# Patient Record
Sex: Male | Born: 1960 | Race: White | Hispanic: No | Smoking: Current every day smoker
Health system: Southern US, Community
[De-identification: ages and names within clinical notes are randomized; demographics above are authoritative.]

## PROBLEM LIST (undated history)

## (undated) DIAGNOSIS — G8929 Other chronic pain: Secondary | ICD-10-CM

## (undated) DIAGNOSIS — K219 Gastro-esophageal reflux disease without esophagitis: Secondary | ICD-10-CM

## (undated) DIAGNOSIS — M549 Dorsalgia, unspecified: Secondary | ICD-10-CM

## (undated) DIAGNOSIS — F329 Major depressive disorder, single episode, unspecified: Secondary | ICD-10-CM

## (undated) DIAGNOSIS — F32A Depression, unspecified: Secondary | ICD-10-CM

## (undated) DIAGNOSIS — J40 Bronchitis, not specified as acute or chronic: Secondary | ICD-10-CM

## (undated) DIAGNOSIS — K589 Irritable bowel syndrome without diarrhea: Secondary | ICD-10-CM

## (undated) DIAGNOSIS — E0500 Thyrotoxicosis with diffuse goiter without thyrotoxic crisis or storm: Secondary | ICD-10-CM

## (undated) DIAGNOSIS — F419 Anxiety disorder, unspecified: Secondary | ICD-10-CM

## (undated) DIAGNOSIS — M1990 Unspecified osteoarthritis, unspecified site: Secondary | ICD-10-CM

## (undated) HISTORY — PX: APPENDECTOMY: SHX54

---

## 2004-02-01 ENCOUNTER — Emergency Department (HOSPITAL_COMMUNITY): Admission: EM | Admit: 2004-02-01 | Discharge: 2004-02-01 | Payer: Self-pay | Admitting: Emergency Medicine

## 2004-09-22 ENCOUNTER — Ambulatory Visit (HOSPITAL_COMMUNITY): Admission: RE | Admit: 2004-09-22 | Discharge: 2004-09-22 | Payer: Self-pay | Admitting: Ophthalmology

## 2005-03-07 ENCOUNTER — Ambulatory Visit: Payer: Self-pay | Admitting: Family Medicine

## 2005-12-04 ENCOUNTER — Ambulatory Visit: Payer: Self-pay | Admitting: Family Medicine

## 2006-01-18 ENCOUNTER — Ambulatory Visit: Payer: Self-pay | Admitting: Family Medicine

## 2006-02-15 ENCOUNTER — Ambulatory Visit: Payer: Self-pay | Admitting: Family Medicine

## 2006-09-11 ENCOUNTER — Ambulatory Visit: Payer: Self-pay | Admitting: Family Medicine

## 2006-09-25 ENCOUNTER — Ambulatory Visit: Payer: Self-pay | Admitting: Family Medicine

## 2006-11-06 ENCOUNTER — Ambulatory Visit: Payer: Self-pay | Admitting: Family Medicine

## 2007-02-13 ENCOUNTER — Ambulatory Visit: Payer: Self-pay | Admitting: Physician Assistant

## 2007-04-09 ENCOUNTER — Ambulatory Visit: Payer: Self-pay | Admitting: Family Medicine

## 2010-11-11 ENCOUNTER — Emergency Department (HOSPITAL_COMMUNITY)
Admission: EM | Admit: 2010-11-11 | Discharge: 2010-11-12 | Disposition: A | Discharge status: Other Acute Inpatient Hospital | Payer: Self-pay | Source: Home / Self Care | Admitting: Emergency Medicine

## 2010-11-12 ENCOUNTER — Inpatient Hospital Stay (HOSPITAL_COMMUNITY)
Admission: EM | Admit: 2010-11-12 | Discharge: 2010-11-14 | Payer: Self-pay | Attending: Psychiatry | Admitting: Psychiatry
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
## 2011-02-06 LAB — BASIC METABOLIC PANEL
BUN: 6 mg/dL (ref 6–23)
CO2: 26 mEq/L (ref 19–32)
Calcium: 8.9 mg/dL (ref 8.4–10.5)
Chloride: 106 mEq/L (ref 96–112)
Creatinine, Ser: 0.85 mg/dL (ref 0.4–1.5)
GFR calc Af Amer: >60 mL/min (ref >60)
GFR calc non Af Amer: >60 mL/min (ref >60)
Glucose, Bld: 132 mg/dL — ABNORMAL HIGH (ref 70–99)
Potassium: 3.7 mEq/L (ref 3.5–5.1)
Sodium: 140 mEq/L (ref 135–145)

## 2011-02-06 LAB — CBC
HCT: 42.3 % (ref 39.0–52.0)
Hemoglobin: 14.6 g/dL (ref 13.0–17.0)
MCH: 29.5 pg (ref 26.0–34.0)
MCHC: 34.5 g/dL (ref 30.0–36.0)
MCV: 85.5 fL (ref 78.0–100.0)
Platelets: 263 10*3/uL (ref 150–400)
RBC: 4.95 MIL/uL (ref 4.22–5.81)
RDW: 13.4 % (ref 11.5–15.5)
WBC: 8.6 10*3/uL (ref 4.0–10.5)

## 2011-02-06 LAB — DIFFERENTIAL
Basophils Absolute: 0.1 10*3/uL (ref 0.0–0.1)
Basophils Relative: 1 % (ref 0–1)
Eosinophils Absolute: 0.4 10*3/uL (ref 0.0–0.7)
Eosinophils Relative: 5 % (ref 0–5)
Lymphocytes Relative: 35 % (ref 12–46)
Lymphs Abs: 3 10*3/uL (ref 0.7–4.0)
Monocytes Absolute: 0.6 10*3/uL (ref 0.1–1.0)
Monocytes Relative: 7 % (ref 3–12)
Neutro Abs: 4.6 10*3/uL (ref 1.7–7.7)
Neutrophils Relative %: 53 % (ref 43–77)

## 2011-02-06 LAB — RAPID URINE DRUG SCREEN, HOSP PERFORMED
Amphetamines: NOT DETECTED
Barbiturates: NOT DETECTED
Benzodiazepines: POSITIVE — AB
Cocaine: POSITIVE — AB
Opiates: NOT DETECTED
Tetrahydrocannabinol: NOT DETECTED

## 2011-02-06 LAB — ETHANOL: Alcohol, Ethyl (B): 127 mg/dL — ABNORMAL HIGH (ref 0–10)

## 2012-02-10 ENCOUNTER — Emergency Department (HOSPITAL_COMMUNITY)
Admission: EM | Admit: 2012-02-10 | Discharge: 2012-02-10 | Disposition: A | Discharge status: Home / Self Care | Payer: Self-pay | Attending: Emergency Medicine | Admitting: Emergency Medicine

## 2012-02-10 ENCOUNTER — Encounter (HOSPITAL_COMMUNITY): Payer: Self-pay

## 2012-02-10 DIAGNOSIS — T3110 Burns involving 10-19% of body surface with 0% to 9% third degree burns: Secondary | ICD-10-CM | POA: Insufficient documentation

## 2012-02-10 DIAGNOSIS — T3111 Burns involving 10-19% of body surface with 10-19% third degree burns: Secondary | ICD-10-CM | POA: Insufficient documentation

## 2012-02-10 DIAGNOSIS — T22299A Burn of second degree of multiple sites of unspecified shoulder and upper limb, except wrist and hand, initial encounter: Secondary | ICD-10-CM | POA: Insufficient documentation

## 2012-02-10 DIAGNOSIS — X000XXA Exposure to flames in uncontrolled fire in building or structure, initial encounter: Secondary | ICD-10-CM | POA: Insufficient documentation

## 2012-02-10 DIAGNOSIS — Y92009 Unspecified place in unspecified non-institutional (private) residence as the place of occurrence of the external cause: Secondary | ICD-10-CM | POA: Insufficient documentation

## 2012-02-10 DIAGNOSIS — T2026XA Burn of second degree of forehead and cheek, initial encounter: Secondary | ICD-10-CM | POA: Insufficient documentation

## 2012-02-10 MED ORDER — SODIUM CHLORIDE 0.9 % IV SOLN: Freq: Once | INTRAVENOUS | Status: DC

## 2012-02-10 MED ORDER — MORPHINE SULFATE 4 MG/ML IJ SOLN
4.0000 mg | Freq: Once | INTRAMUSCULAR | Status: AC
  Administered 2012-02-10: 4 mg via INTRAVENOUS
  Filled 2012-02-10: qty 1

## 2012-02-10 MED ORDER — ONDANSETRON HCL 4 MG/2ML IJ SOLN: 4.0000 mg | Freq: Once | INTRAMUSCULAR | Status: DC

## 2012-02-10 MED ORDER — HYDROCODONE-ACETAMINOPHEN 5-325 MG PO TABS: 1.0000 | ORAL_TABLET | ORAL | Status: AC | PRN

## 2012-02-10 MED ORDER — ONDANSETRON HCL 4 MG/2ML IJ SOLN
4.0000 mg | Freq: Once | INTRAMUSCULAR | Status: AC
  Administered 2012-02-10: 4 mg via INTRAVENOUS
  Filled 2012-02-10: qty 2

## 2012-02-10 MED ORDER — SODIUM CHLORIDE 0.9 % IV BOLUS (SEPSIS)
1000.0000 mL | Freq: Once | INTRAVENOUS | Status: DC
Start: 2012-02-10 — End: 2012-02-10

## 2012-02-10 MED ORDER — MORPHINE SULFATE 4 MG/ML IJ SOLN: 4.0000 mg | Freq: Once | INTRAMUSCULAR | Status: DC

## 2012-02-10 MED ORDER — SILVER SULFADIAZINE 1 % EX CREA: TOPICAL_CREAM | Freq: Every day | CUTANEOUS | Status: AC

## 2012-02-10 MED ORDER — SODIUM CHLORIDE 0.9 % IV BOLUS (SEPSIS)
1000.0000 mL | Freq: Once | INTRAVENOUS | Status: AC
  Administered 2012-02-10: 1000 mL via INTRAVENOUS

## 2012-02-10 MED ORDER — TETANUS-DIPHTH-ACELL PERTUSSIS 5-2.5-18.5 LF-MCG/0.5 IM SUSP
0.5000 mL | Freq: Once | INTRAMUSCULAR | Status: AC
  Administered 2012-02-10: 0.5 mL via INTRAMUSCULAR
  Filled 2012-02-10 (×2): qty 0.5

## 2012-02-10 MED ORDER — BACITRACIN ZINC 500 UNIT/GM EX OINT
TOPICAL_OINTMENT | Freq: Once | CUTANEOUS | Status: AC
  Administered 2012-02-10: 1 via TOPICAL
  Filled 2012-02-10: qty 0.9

## 2012-02-10 MED ORDER — SILVER SULFADIAZINE 1 % EX CREA: TOPICAL_CREAM | Freq: Once | CUTANEOUS | Status: DC

## 2012-02-10 MED ORDER — SILVER SULFADIAZINE 1 % EX CREA
TOPICAL_CREAM | Freq: Once | CUTANEOUS | Status: AC
  Administered 2012-02-10: 02:00:00 via TOPICAL
  Filled 2012-02-10: qty 50
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
## 2012-02-10 NOTE — ED Notes (Signed)
Pt alert & oriented x4, stable gait. Pt given discharge instructions, paperwork & prescription(s). Patient instructed to stop at the registration desk to finish any additional paperwork. pt verbalized understanding. Pt left department w/ no further questions.  
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
## 2013-03-31 ENCOUNTER — Emergency Department (HOSPITAL_COMMUNITY): Payer: Self-pay

## 2013-03-31 ENCOUNTER — Emergency Department (HOSPITAL_COMMUNITY)
Admission: EM | Admit: 2013-03-31 | Discharge: 2013-03-31 | Disposition: A | Discharge status: Home / Self Care | Payer: Self-pay | Attending: Emergency Medicine | Admitting: Emergency Medicine

## 2013-03-31 ENCOUNTER — Encounter (HOSPITAL_COMMUNITY): Payer: Self-pay | Admitting: *Deleted

## 2013-03-31 DIAGNOSIS — K219 Gastro-esophageal reflux disease without esophagitis: Secondary | ICD-10-CM | POA: Insufficient documentation

## 2013-03-31 DIAGNOSIS — R1110 Vomiting, unspecified: Secondary | ICD-10-CM | POA: Insufficient documentation

## 2013-03-31 DIAGNOSIS — Z880 Allergy status to penicillin: Secondary | ICD-10-CM | POA: Insufficient documentation

## 2013-03-31 DIAGNOSIS — Z8709 Personal history of other diseases of the respiratory system: Secondary | ICD-10-CM | POA: Insufficient documentation

## 2013-03-31 DIAGNOSIS — F17200 Nicotine dependence, unspecified, uncomplicated: Secondary | ICD-10-CM | POA: Insufficient documentation

## 2013-03-31 DIAGNOSIS — Z79899 Other long term (current) drug therapy: Secondary | ICD-10-CM | POA: Insufficient documentation

## 2013-03-31 HISTORY — DX: Bronchitis, not specified as acute or chronic: J40

## 2013-03-31 LAB — COMPREHENSIVE METABOLIC PANEL
ALT: 27 U/L (ref 0–53)
AST: 30 U/L (ref 0–37)
Albumin: 5.1 g/dL (ref 3.5–5.2)
Alkaline Phosphatase: 102 U/L (ref 39–117)
BUN: 10 mg/dL (ref 6–23)
CO2: 15 mEq/L — ABNORMAL LOW (ref 19–32)
Calcium: 9.8 mg/dL (ref 8.4–10.5)
Chloride: 87 mEq/L — ABNORMAL LOW (ref 96–112)
Creatinine, Ser: 0.87 mg/dL (ref 0.50–1.35)
GFR calc Af Amer: >90 mL/min (ref >90)
GFR calc non Af Amer: >90 mL/min (ref >90)
Glucose, Bld: 76 mg/dL (ref 70–99)
Potassium: 4.3 mEq/L (ref 3.5–5.1)
Sodium: 129 mEq/L — ABNORMAL LOW (ref 135–145)
Total Bilirubin: 0.7 mg/dL (ref 0.3–1.2)
Total Protein: 8.4 g/dL — ABNORMAL HIGH (ref 6.0–8.3)

## 2013-03-31 LAB — CBC WITH DIFFERENTIAL/PLATELET
Basophils Absolute: 0 10*3/uL (ref 0.0–0.1)
Basophils Relative: 0 % (ref 0–1)
Eosinophils Absolute: 0 10*3/uL (ref 0.0–0.7)
Eosinophils Relative: 0 % (ref 0–5)
HCT: 45.8 % (ref 39.0–52.0)
Hemoglobin: 16.1 g/dL (ref 13.0–17.0)
Lymphocytes Relative: 8 % — ABNORMAL LOW (ref 12–46)
Lymphs Abs: 1.2 10*3/uL (ref 0.7–4.0)
MCH: 30.9 pg (ref 26.0–34.0)
MCHC: 35.2 g/dL (ref 30.0–36.0)
MCV: 87.9 fL (ref 78.0–100.0)
Monocytes Absolute: 0.9 10*3/uL (ref 0.1–1.0)
Monocytes Relative: 6 % (ref 3–12)
Neutro Abs: 13.6 10*3/uL — ABNORMAL HIGH (ref 1.7–7.7)
Neutrophils Relative %: 86 % — ABNORMAL HIGH (ref 43–77)
Platelets: 343 10*3/uL (ref 150–400)
RBC: 5.21 MIL/uL (ref 4.22–5.81)
RDW: 14.3 % (ref 11.5–15.5)
WBC: 15.7 10*3/uL — ABNORMAL HIGH (ref 4.0–10.5)

## 2013-03-31 LAB — ETHANOL: Alcohol, Ethyl (B): <11 mg/dL (ref 0–11)

## 2013-03-31 LAB — TROPONIN I: Troponin I: <0.3 ng/mL (ref <0.30)

## 2013-03-31 MED ORDER — ALPRAZOLAM 1 MG PO TABS: 1.0000 mg | ORAL_TABLET | Freq: Every evening | ORAL | Status: DC | PRN

## 2013-03-31 MED ORDER — PROMETHAZINE HCL 25 MG PO TABS: 25.0000 mg | ORAL_TABLET | Freq: Four times a day (QID) | ORAL | Status: DC | PRN

## 2013-03-31 MED ORDER — PANTOPRAZOLE SODIUM 40 MG IV SOLR
40.0000 mg | Freq: Once | INTRAVENOUS | Status: AC
  Administered 2013-03-31: 40 mg via INTRAVENOUS
  Filled 2013-03-31: qty 40

## 2013-03-31 MED ORDER — HYDROCODONE-ACETAMINOPHEN 5-325 MG PO TABS: ORAL_TABLET | ORAL | Status: DC

## 2013-03-31 MED ORDER — ONDANSETRON HCL 4 MG/2ML IJ SOLN
4.0000 mg | Freq: Once | INTRAMUSCULAR | Status: AC
  Administered 2013-03-31: 4 mg via INTRAVENOUS
  Filled 2013-03-31: qty 2

## 2013-03-31 MED ORDER — RANITIDINE HCL 150 MG PO CAPS: 150.0000 mg | ORAL_CAPSULE | Freq: Two times a day (BID) | ORAL | Status: DC

## 2013-03-31 MED ORDER — GI COCKTAIL ~~LOC~~
30.0000 mL | Freq: Once | ORAL | Status: AC
  Administered 2013-03-31: 30 mL via ORAL
  Filled 2013-03-31: qty 30

## 2013-03-31 MED ORDER — SODIUM CHLORIDE 0.9 % IV BOLUS (SEPSIS)
1000.0000 mL | Freq: Once | INTRAVENOUS | Status: AC
  Administered 2013-03-31: 1000 mL via INTRAVENOUS

## 2013-03-31 NOTE — ED Notes (Signed)
Vomiting, sob, chest pain, diarrhea.

## 2013-03-31 NOTE — ED Provider Notes (Signed)
History     CSN: [id number]  Arrival date & time 03/31/13  1825   First MD Initiated Contact with Patient 03/31/13 2024      Chief Complaint  Patient presents with  . Shortness of Breath    (Consider location/radiation/quality/duration/timing/severity/associated sxs/prior treatment) Patient is a 52 y.o. male presenting with shortness of breath. The history is provided by the patient (pt complains of indigestion and vomiting.  he has been out of his xanax and hydrocodone for one week). No language interpreter was used.  Shortness of Breath Severity:  Moderate Onset quality:  Gradual Timing:  Intermittent Progression:  Waxing and waning Chronicity:  Recurrent Context: not activity   Relieved by:  Nothing Associated symptoms: vomiting   Associated symptoms: no abdominal pain, no chest pain, no cough, no headaches and no rash     Past Medical History  Diagnosis Date  . Bronchitis     Past Surgical History  Procedure Laterality Date  . Appendectomy      History reviewed. No pertinent family history.  History  Substance Use Topics  . Smoking status: Current Every Day Smoker -- 1.00 packs/day  . Smokeless tobacco: Not on file  . Alcohol Use: Yes     Comment: daily      Review of Systems  Constitutional: Negative for appetite change and fatigue.  HENT: Negative for congestion, sinus pressure and ear discharge.   Eyes: Negative for discharge.  Respiratory: Positive for shortness of breath. Negative for cough.   Cardiovascular: Negative for chest pain.  Gastrointestinal: Positive for vomiting. Negative for abdominal pain and diarrhea.  Genitourinary: Negative for frequency and hematuria.  Musculoskeletal: Negative for back pain.  Skin: Negative for rash.  Neurological: Negative for seizures and headaches.  Psychiatric/Behavioral: Negative for hallucinations.    Allergies  Penicillins  Home Medications   Current Outpatient Rx  Name  Route  Sig  Dispense   Refill  . ALPRAZolam (XANAX) 1 MG tablet   Oral   Take 1 mg by mouth 2 (two) times daily as needed for sleep or anxiety.          Marland Kitchen HYDROcodone-acetaminophen (NORCO/VICODIN) 5-325 MG per tablet   Oral   Take 1 tablet by mouth every 6 (six) hours as needed for pain.         . meloxicam (MOBIC) 15 MG tablet   Oral   Take 15 mg by mouth daily.         . naproxen sodium (ALEVE) 220 MG tablet   Oral   Take 440 mg by mouth daily as needed.         . ALPRAZolam (XANAX) 1 MG tablet   Oral   Take 1 tablet (1 mg total) by mouth at bedtime as needed for sleep.   15 tablet   0   . HYDROcodone-acetaminophen (NORCO/VICODIN) 5-325 MG per tablet      Take one twice a day for pain   30 tablet   0   . promethazine (PHENERGAN) 25 MG tablet   Oral   Take 1 tablet (25 mg total) by mouth every 6 (six) hours as needed for nausea.   30 tablet   0   . ranitidine (ZANTAC) 150 MG capsule   Oral   Take 1 capsule (150 mg total) by mouth 2 (two) times daily.   60 capsule   0     BP 145/85  Pulse 100  Temp(Src) 96.9 F (36.1 C) (Oral)  Resp 18  Ht  5\' 8"  (1.727 m)  Wt 135 lb (61.236 kg)  BMI 20.53 kg/m2  SpO2 96%  Physical Exam  Constitutional: He is oriented to person, place, and time. He appears well-developed.  HENT:  Head: Normocephalic.  Eyes: Conjunctivae and EOM are normal. No scleral icterus.  Neck: Neck supple. No thyromegaly present.  Cardiovascular: Normal rate and regular rhythm.  Exam reveals no gallop and no friction rub.   No murmur heard. Pulmonary/Chest: No stridor. He has no wheezes. He has no rales. He exhibits no tenderness.  Abdominal: He exhibits no distension. There is tenderness. There is no rebound.  Musculoskeletal: Normal range of motion. He exhibits no edema.  Lymphadenopathy:    He has no cervical adenopathy.  Neurological: He is oriented to person, place, and time. Coordination normal.  Skin: No rash noted. No erythema.  Psychiatric: He has a  normal mood and affect. His behavior is normal.    ED Course  Procedures (including critical care time)  Labs Reviewed  CBC WITH DIFFERENTIAL - Abnormal; Notable for the following:    WBC 15.7 (*)    Neutrophils Relative 86 (*)    Neutro Abs 13.6 (*)    Lymphocytes Relative 8 (*)    All other components within normal limits  COMPREHENSIVE METABOLIC PANEL - Abnormal; Notable for the following:    Sodium 129 (*)    Chloride 87 (*)    CO2 15 (*)    Total Protein 8.4 (*)    All other components within normal limits  TROPONIN I  ETHANOL   Dg Chest 2 View  03/31/2013  *RADIOLOGY REPORT*  Clinical Data: Shortness of breath, vomiting since 2:00 p.m. today, history smoking  CHEST - 2 VIEW  Comparison: 11/09/2009  Findings: Normal heart size, mediastinal contours, and pulmonary vascularity. Mild chronic bronchitic changes and hyperinflation. No acute infiltrate, pleural effusion or pneumothorax. Bones unremarkable.  IMPRESSION: Chronic bronchitic changes and hyperinflation, cannot exclude asthma.   Original Report Authenticated By: Ulyses Southward, M.D.      1. GERD (gastroesophageal reflux disease)   2. Vomiting       MDM          Benny Lennert, MD 03/31/13 2127

## 2013-03-31 NOTE — ED Notes (Signed)
Pt alert & oriented x4, stable gait. Patient given discharge instructions, paperwork & prescription(s). Patient  instructed to stop at the registration desk to finish any additional paperwork. Patient verbalized understanding. Pt left department w/ no further questions. 

## 2014-02-04 ENCOUNTER — Emergency Department (HOSPITAL_COMMUNITY): Payer: Self-pay

## 2014-02-04 ENCOUNTER — Emergency Department (HOSPITAL_COMMUNITY)
Admission: EM | Admit: 2014-02-04 | Discharge: 2014-02-04 | Disposition: A | Discharge status: Home / Self Care | Payer: Self-pay | Attending: Emergency Medicine | Admitting: Emergency Medicine

## 2014-02-04 ENCOUNTER — Encounter (HOSPITAL_COMMUNITY): Payer: Self-pay | Admitting: Emergency Medicine

## 2014-02-04 DIAGNOSIS — F3289 Other specified depressive episodes: Secondary | ICD-10-CM | POA: Insufficient documentation

## 2014-02-04 DIAGNOSIS — Z79899 Other long term (current) drug therapy: Secondary | ICD-10-CM | POA: Insufficient documentation

## 2014-02-04 DIAGNOSIS — Z880 Allergy status to penicillin: Secondary | ICD-10-CM | POA: Insufficient documentation

## 2014-02-04 DIAGNOSIS — F329 Major depressive disorder, single episode, unspecified: Secondary | ICD-10-CM | POA: Insufficient documentation

## 2014-02-04 DIAGNOSIS — F17200 Nicotine dependence, unspecified, uncomplicated: Secondary | ICD-10-CM | POA: Insufficient documentation

## 2014-02-04 DIAGNOSIS — Z8709 Personal history of other diseases of the respiratory system: Secondary | ICD-10-CM | POA: Insufficient documentation

## 2014-02-04 DIAGNOSIS — Z9089 Acquired absence of other organs: Secondary | ICD-10-CM | POA: Insufficient documentation

## 2014-02-04 DIAGNOSIS — R197 Diarrhea, unspecified: Secondary | ICD-10-CM | POA: Insufficient documentation

## 2014-02-04 DIAGNOSIS — Z791 Long term (current) use of non-steroidal anti-inflammatories (NSAID): Secondary | ICD-10-CM | POA: Insufficient documentation

## 2014-02-04 DIAGNOSIS — F411 Generalized anxiety disorder: Secondary | ICD-10-CM | POA: Insufficient documentation

## 2014-02-04 DIAGNOSIS — Z8639 Personal history of other endocrine, nutritional and metabolic disease: Secondary | ICD-10-CM | POA: Insufficient documentation

## 2014-02-04 DIAGNOSIS — R112 Nausea with vomiting, unspecified: Secondary | ICD-10-CM | POA: Insufficient documentation

## 2014-02-04 DIAGNOSIS — R109 Unspecified abdominal pain: Secondary | ICD-10-CM

## 2014-02-04 DIAGNOSIS — R1084 Generalized abdominal pain: Secondary | ICD-10-CM | POA: Insufficient documentation

## 2014-02-04 DIAGNOSIS — Z862 Personal history of diseases of the blood and blood-forming organs and certain disorders involving the immune mechanism: Secondary | ICD-10-CM | POA: Insufficient documentation

## 2014-02-04 DIAGNOSIS — K219 Gastro-esophageal reflux disease without esophagitis: Secondary | ICD-10-CM | POA: Insufficient documentation

## 2014-02-04 HISTORY — DX: Anxiety disorder, unspecified: F41.9

## 2014-02-04 HISTORY — DX: Gastro-esophageal reflux disease without esophagitis: K21.9

## 2014-02-04 HISTORY — DX: Major depressive disorder, single episode, unspecified: F32.9

## 2014-02-04 HISTORY — DX: Depression, unspecified: F32.A

## 2014-02-04 HISTORY — DX: Thyrotoxicosis with diffuse goiter without thyrotoxic crisis or storm: E05.00

## 2014-02-04 LAB — CBC WITH DIFFERENTIAL/PLATELET
Basophils Absolute: 0 10*3/uL (ref 0.0–0.1)
Basophils Relative: 1 % (ref 0–1)
EOS ABS: 0.2 10*3/uL (ref 0.0–0.7)
EOS PCT: 3 % (ref 0–5)
HCT: 42 % (ref 39.0–52.0)
HEMOGLOBIN: 14.5 g/dL (ref 13.0–17.0)
LYMPHS ABS: 1.7 10*3/uL (ref 0.7–4.0)
Lymphocytes Relative: 20 % (ref 12–46)
MCH: 30.5 pg (ref 26.0–34.0)
MCHC: 34.5 g/dL (ref 30.0–36.0)
MCV: 88.4 fL (ref 78.0–100.0)
MONOS PCT: 9 % (ref 3–12)
Monocytes Absolute: 0.8 10*3/uL (ref 0.1–1.0)
Neutro Abs: 5.6 10*3/uL (ref 1.7–7.7)
Neutrophils Relative %: 68 % (ref 43–77)
PLATELETS: 283 10*3/uL (ref 150–400)
RBC: 4.75 MIL/uL (ref 4.22–5.81)
RDW: 13 % (ref 11.5–15.5)
WBC: 8.3 10*3/uL (ref 4.0–10.5)

## 2014-02-04 LAB — COMPREHENSIVE METABOLIC PANEL
ALBUMIN: 3.7 g/dL (ref 3.5–5.2)
ALT: 17 U/L (ref 0–53)
AST: 24 U/L (ref 0–37)
Alkaline Phosphatase: 96 U/L (ref 39–117)
BILIRUBIN TOTAL: 0.4 mg/dL (ref 0.3–1.2)
BUN: 9 mg/dL (ref 6–23)
CHLORIDE: 105 meq/L (ref 96–112)
CO2: 25 mEq/L (ref 19–32)
CREATININE: 0.72 mg/dL (ref 0.50–1.35)
Calcium: 8.9 mg/dL (ref 8.4–10.5)
GFR calc non Af Amer: >90 mL/min (ref >90)
GLUCOSE: 96 mg/dL (ref 70–99)
Potassium: 4.7 mEq/L (ref 3.7–5.3)
Sodium: 141 mEq/L (ref 137–147)
TOTAL PROTEIN: 7.2 g/dL (ref 6.0–8.3)

## 2014-02-04 LAB — LIPASE, BLOOD: Lipase: 64 U/L — ABNORMAL HIGH (ref 11–59)

## 2014-02-04 MED ORDER — HYDROCODONE-ACETAMINOPHEN 5-325 MG PO TABS: 1.0000 | ORAL_TABLET | Freq: Four times a day (QID) | ORAL | Status: DC | PRN

## 2014-02-04 MED ORDER — SODIUM CHLORIDE 0.9 % IV BOLUS (SEPSIS)
250.0000 mL | Freq: Once | INTRAVENOUS | Status: AC
  Administered 2014-02-04: 250 mL via INTRAVENOUS

## 2014-02-04 MED ORDER — LOPERAMIDE HCL 2 MG PO TABS: 2.0000 mg | ORAL_TABLET | Freq: Four times a day (QID) | ORAL | Status: DC | PRN

## 2014-02-04 MED ORDER — HYDROMORPHONE HCL PF 1 MG/ML IJ SOLN
1.0000 mg | Freq: Once | INTRAMUSCULAR | Status: AC
  Administered 2014-02-04: 1 mg via INTRAVENOUS
  Filled 2014-02-04: qty 1

## 2014-02-04 MED ORDER — IOHEXOL 300 MG/ML  SOLN
50.0000 mL | Freq: Once | INTRAMUSCULAR | Status: AC | PRN
  Administered 2014-02-04: 25 mL via ORAL

## 2014-02-04 MED ORDER — IOHEXOL 300 MG/ML  SOLN
100.0000 mL | Freq: Once | INTRAMUSCULAR | Status: AC | PRN
  Administered 2014-02-04: 100 mL via INTRAVENOUS

## 2014-02-04 MED ORDER — ONDANSETRON HCL 4 MG/2ML IJ SOLN
4.0000 mg | Freq: Once | INTRAMUSCULAR | Status: AC
  Administered 2014-02-04: 4 mg via INTRAVENOUS
  Filled 2014-02-04: qty 2

## 2014-02-04 MED ORDER — SODIUM CHLORIDE 0.9 % IV BOLUS (SEPSIS): 1000.0000 mL | Freq: Once | INTRAVENOUS | Status: DC

## 2014-02-04 MED ORDER — SODIUM CHLORIDE 0.9 % IV SOLN: INTRAVENOUS | Status: DC

## 2014-02-04 MED ORDER — ONDANSETRON 4 MG PO TBDP: 4.0000 mg | ORAL_TABLET | Freq: Three times a day (TID) | ORAL | Status: DC | PRN

## 2014-02-04 NOTE — ED Provider Notes (Signed)
CSN: [id number]     Arrival date & time 02/04/14  4098 History   First MD Initiated Contact with Patient 02/04/14 [phone]     Chief Complaint  Patient presents with  . Abdominal Pain     (Consider location/radiation/quality/duration/timing/severity/associated sxs/prior Treatment) Patient is a 53 y.o. male presenting with abdominal pain. The history is provided by the patient.  Abdominal Pain Associated symptoms: diarrhea, nausea and vomiting   Associated symptoms: no chest pain, no dysuria, no fever and no shortness of breath    Patient with complaint of abdominal pain generalized for about a week. Associated with occasional vomiting occasional diarrhea. Bowel movements have been black in color however patient's been on Pepto-Bismol. Patient also with a single bit of red but the bowel movements. No blood in the vomit. Patient is followed by [facility]. Patient states that he's feeling weak and dehydrated.  Past Medical History  Diagnosis Date  . Bronchitis   . GERD (gastroesophageal reflux disease)   . Graves disease   . Depression   . Anxiety    Past Surgical History  Procedure Laterality Date  . Appendectomy     Family History  Problem Relation Age of Onset  . Stroke Mother    History  Substance Use Topics  . Smoking status: Current Every Day Smoker -- 1.00 packs/day for 35 years    Types: Cigarettes  . Smokeless tobacco: Never Used  . Alcohol Use: 7.2 oz/week    12 Cans of beer per week     Comment: daily    Review of Systems  Constitutional: Negative for fever.  HENT: Negative for congestion.   Eyes: Negative for redness.  Respiratory: Negative for shortness of breath.   Cardiovascular: Negative for chest pain.  Gastrointestinal: Positive for nausea, vomiting, abdominal pain and diarrhea.  Genitourinary: Negative for dysuria.  Musculoskeletal: Negative for back pain.  Skin: Negative for rash.  Neurological: Negative for headaches.   Hematological: Does not bruise/bleed easily.  Psychiatric/Behavioral: Negative for confusion.      Allergies  Penicillins  Home Medications   Current Outpatient Rx  Name  Route  Sig  Dispense  Refill  . ALPRAZolam (XANAX) 1 MG tablet   Oral   Take 2 mg by mouth at bedtime.          Marland Kitchen esomeprazole (NEXIUM) 20 MG capsule   Oral   Take 20 mg by mouth daily.         . meloxicam (MOBIC) 15 MG tablet   Oral   Take 15 mg by mouth daily.         Marland Kitchen HYDROcodone-acetaminophen (NORCO/VICODIN) 5-325 MG per tablet   Oral   Take 1 tablet by mouth 3 (three) times daily as needed for moderate pain.          Marland Kitchen HYDROcodone-acetaminophen (NORCO/VICODIN) 5-325 MG per tablet   Oral   Take 1-2 tablets by mouth every 6 (six) hours as needed for moderate pain.   20 tablet   0   . loperamide (IMODIUM A-D) 2 MG tablet   Oral   Take 1 tablet (2 mg total) by mouth 4 (four) times daily as needed for diarrhea or loose stools.   30 tablet   0   . ondansetron (ZOFRAN ODT) 4 MG disintegrating tablet   Oral   Take 1 tablet (4 mg total) by mouth every 8 (eight) hours as needed.   10 tablet   1    BP 129/92  Pulse 92  Temp(Src) 98.1 F (36.7 C) (Oral)  Ht 5\' 8"  (1.727 m)  Wt 130 lb (58.968 kg)  BMI 19.77 kg/m2  SpO2 99% Physical Exam  Nursing note and vitals reviewed. Constitutional: He is oriented to person, place, and time. He appears well-developed and well-nourished. No distress.  HENT:  Head: Normocephalic and atraumatic.  Mouth/Throat: Oropharynx is clear and moist.  Eyes: Conjunctivae and EOM are normal. Pupils are equal, round, and reactive to light.  Neck: Normal range of motion.  Cardiovascular: Normal rate and regular rhythm.   No murmur heard. Pulmonary/Chest: Effort normal and breath sounds normal. No respiratory distress.  Abdominal: Soft. Bowel sounds are normal. There is no tenderness.  Musculoskeletal: Normal range of motion. He exhibits no edema.   Neurological: He is alert and oriented to person, place, and time. No cranial nerve deficit. He exhibits normal muscle tone. Coordination normal.  Skin: Skin is warm. No rash noted. No erythema.    ED Course  Procedures (including critical care time) Labs Review Labs Reviewed  LIPASE, BLOOD - Abnormal; Notable for the following:    Lipase 64 (*)    All other components within normal limits  COMPREHENSIVE METABOLIC PANEL  CBC WITH DIFFERENTIAL   Results for orders placed during the hospital encounter of 02/04/14  COMPREHENSIVE METABOLIC PANEL      Result Value Ref Range   Sodium 141  137 - 147 mEq/L   Potassium 4.7  3.7 - 5.3 mEq/L   Chloride 105  96 - 112 mEq/L   CO2 25  19 - 32 mEq/L   Glucose, Bld 96  70 - 99 mg/dL   BUN 9  6 - 23 mg/dL   Creatinine, Ser 1.61  0.50 - 1.35 mg/dL   Calcium 8.9  8.4 - 09.6 mg/dL   Total Protein 7.2  6.0 - 8.3 g/dL   Albumin 3.7  3.5 - 5.2 g/dL   AST 24  0 - 37 U/L   ALT 17  0 - 53 U/L   Alkaline Phosphatase 96  39 - 117 U/L   Total Bilirubin 0.4  0.3 - 1.2 mg/dL   GFR calc non Af Amer >90  >90 mL/min   GFR calc Af Amer >90  >90 mL/min  LIPASE, BLOOD      Result Value Ref Range   Lipase 64 (*) 11 - 59 U/L  CBC WITH DIFFERENTIAL      Result Value Ref Range   WBC 8.3  4.0 - 10.5 K/uL   RBC 4.75  4.22 - 5.81 MIL/uL   Hemoglobin 14.5  13.0 - 17.0 g/dL   HCT 04.5  40.9 - 81.1 %   MCV 88.4  78.0 - 100.0 fL   MCH 30.5  26.0 - 34.0 pg   MCHC 34.5  30.0 - 36.0 g/dL   RDW 91.4  78.2 - 95.6 %   Platelets 283  150 - 400 K/uL   Neutrophils Relative % 68  43 - 77 %   Neutro Abs 5.6  1.7 - 7.7 K/uL   Lymphocytes Relative 20  12 - 46 %   Lymphs Abs 1.7  0.7 - 4.0 K/uL   Monocytes Relative 9  3 - 12 %   Monocytes Absolute 0.8  0.1 - 1.0 K/uL   Eosinophils Relative 3  0 - 5 %   Eosinophils Absolute 0.2  0.0 - 0.7 K/uL   Basophils Relative 1  0 - 1 %   Basophils Absolute 0.0  0.0 - 0.1 K/uL    Imaging Review Dg Chest 2 View  02/04/2014    CLINICAL DATA Pain with nausea  EXAM CHEST  2 VIEW  COMPARISON Mar 31, 2013  FINDINGS Lungs are slightly hyperexpanded but clear. Heart size and pulmonary vascularity are normal. No adenopathy. No bone lesions.  IMPRESSION No edema or consolidation.  Lungs mildly hyperexpanded.  SIGNATURE  Electronically Signed   By: Bretta BangWilliam  Woodruff M.D.   On: 02/04/2014 09:09   Ct Abdomen Pelvis W Contrast  02/04/2014   CLINICAL DATA Abdominal pain  EXAM CT ABDOMEN AND PELVIS WITH CONTRAST  TECHNIQUE Multidetector CT imaging of the abdomen and pelvis was performed using the standard protocol following bolus administration of intravenous contrast.  CONTRAST 100mL OMNIPAQUE IOHEXOL 300 MG/ML SOLN, 25mL OMNIPAQUE IOHEXOL 300 MG/ML SOLN  COMPARISON None.  FINDINGS Lung bases are clear with mild scarring or atelectasis in the right lower lobe.  Liver gallbladder and bile ducts are normal. Pancreas and spleen are normal. Kidneys are normal without obstruction or stone or mass.  Negative for bowel obstruction . Appendectomy clips are noted. Sigmoid diverticulosis is present. No free fluid.  Large cyst in the subcutaneous tissues to the right buttocks. This measures 6.8 x 2.7 cm. This may be a benign cyst or possibly a chronic hematoma. The surrounding fat appears normal.  L5-S1 spondylosis.  No acute fracture.  IMPRESSION Sigmoid diverticulosis without evidence of acute diverticulitis.  Prior appendectomy.  Benign appearing large cyst in the right buttock.  No acute abnormality.  SIGNATURE  Electronically Signed   By: Marlan Palauharles  Clark M.D.   On: 02/04/2014 09:44     EKG Interpretation None      MDM   Final diagnoses:  Abdominal pain    Workup for the persistent abdominal pain with occasional nausea vomiting and diarrhea is negative. Will treat symptomatically. Patient called the primary care Dr. If symptoms persist endoscopies may be required. CT scan here today negative. Labs without significant abnormalities. Other than  the lipase being mildly elevated some mild pancreatitis is a possibility as the cause of pain.    Shelda JakesScott W. Mose Colaizzi, MD 02/04/14 [phone]

## 2014-02-04 NOTE — Discharge Instructions (Signed)
Workup for abdominal pain without any significant findings however followup with your regular Dr. if not improved in the next few days would be important. Take medications as directed. Return for any new or worse symptoms.

## 2014-02-04 NOTE — ED Notes (Signed)
Patient c/o lower abd pain with nausea, vomiting, and diarrhea since Thursday. Per patient progressively worse today. Per patient black watery stools. Patient unsure of any fevers.

## 2014-02-19 ENCOUNTER — Encounter (INDEPENDENT_AMBULATORY_CARE_PROVIDER_SITE_OTHER): Payer: Self-pay | Admitting: *Deleted

## 2014-02-20 ENCOUNTER — Encounter (HOSPITAL_COMMUNITY): Payer: Self-pay | Admitting: Emergency Medicine

## 2014-02-20 ENCOUNTER — Emergency Department (HOSPITAL_COMMUNITY)
Admission: EM | Admit: 2014-02-20 | Discharge: 2014-02-20 | Disposition: A | Discharge status: Home / Self Care | Payer: Self-pay | Attending: Emergency Medicine | Admitting: Emergency Medicine

## 2014-02-20 DIAGNOSIS — F1193 Opioid use, unspecified with withdrawal: Secondary | ICD-10-CM

## 2014-02-20 DIAGNOSIS — Z880 Allergy status to penicillin: Secondary | ICD-10-CM | POA: Insufficient documentation

## 2014-02-20 DIAGNOSIS — R42 Dizziness and giddiness: Secondary | ICD-10-CM | POA: Insufficient documentation

## 2014-02-20 DIAGNOSIS — Z8709 Personal history of other diseases of the respiratory system: Secondary | ICD-10-CM | POA: Insufficient documentation

## 2014-02-20 DIAGNOSIS — F3289 Other specified depressive episodes: Secondary | ICD-10-CM | POA: Insufficient documentation

## 2014-02-20 DIAGNOSIS — Z862 Personal history of diseases of the blood and blood-forming organs and certain disorders involving the immune mechanism: Secondary | ICD-10-CM | POA: Insufficient documentation

## 2014-02-20 DIAGNOSIS — Z8639 Personal history of other endocrine, nutritional and metabolic disease: Secondary | ICD-10-CM | POA: Insufficient documentation

## 2014-02-20 DIAGNOSIS — F1123 Opioid dependence with withdrawal: Secondary | ICD-10-CM

## 2014-02-20 DIAGNOSIS — F329 Major depressive disorder, single episode, unspecified: Secondary | ICD-10-CM | POA: Insufficient documentation

## 2014-02-20 DIAGNOSIS — F19939 Other psychoactive substance use, unspecified with withdrawal, unspecified: Secondary | ICD-10-CM | POA: Insufficient documentation

## 2014-02-20 DIAGNOSIS — G8929 Other chronic pain: Secondary | ICD-10-CM | POA: Insufficient documentation

## 2014-02-20 DIAGNOSIS — Z79899 Other long term (current) drug therapy: Secondary | ICD-10-CM | POA: Insufficient documentation

## 2014-02-20 DIAGNOSIS — R197 Diarrhea, unspecified: Secondary | ICD-10-CM | POA: Insufficient documentation

## 2014-02-20 DIAGNOSIS — K219 Gastro-esophageal reflux disease without esophagitis: Secondary | ICD-10-CM | POA: Insufficient documentation

## 2014-02-20 DIAGNOSIS — F411 Generalized anxiety disorder: Secondary | ICD-10-CM | POA: Insufficient documentation

## 2014-02-20 DIAGNOSIS — F17200 Nicotine dependence, unspecified, uncomplicated: Secondary | ICD-10-CM | POA: Insufficient documentation

## 2014-02-20 DIAGNOSIS — R1110 Vomiting, unspecified: Secondary | ICD-10-CM | POA: Insufficient documentation

## 2014-02-20 LAB — COMPREHENSIVE METABOLIC PANEL
ALT: 13 U/L (ref 0–53)
AST: 18 U/L (ref 0–37)
Albumin: 3.8 g/dL (ref 3.5–5.2)
Alkaline Phosphatase: 92 U/L (ref 39–117)
BUN: 7 mg/dL (ref 6–23)
CALCIUM: 9 mg/dL (ref 8.4–10.5)
CO2: 22 mEq/L (ref 19–32)
Chloride: 104 mEq/L (ref 96–112)
Creatinine, Ser: 0.82 mg/dL (ref 0.50–1.35)
GFR calc non Af Amer: >90 mL/min (ref >90)
GLUCOSE: 86 mg/dL (ref 70–99)
Potassium: 4.2 mEq/L (ref 3.7–5.3)
SODIUM: 142 meq/L (ref 137–147)
TOTAL PROTEIN: 7.5 g/dL (ref 6.0–8.3)
Total Bilirubin: <0.2 mg/dL — ABNORMAL LOW (ref 0.3–1.2)

## 2014-02-20 LAB — URINALYSIS, ROUTINE W REFLEX MICROSCOPIC
BILIRUBIN URINE: NEGATIVE
Glucose, UA: NEGATIVE mg/dL
Hgb urine dipstick: NEGATIVE
Ketones, ur: NEGATIVE mg/dL
Leukocytes, UA: NEGATIVE
NITRITE: NEGATIVE
PH: 6 (ref 5.0–8.0)
Protein, ur: NEGATIVE mg/dL
Urobilinogen, UA: 0.2 mg/dL (ref 0.0–1.0)

## 2014-02-20 LAB — CBC WITH DIFFERENTIAL/PLATELET
BASOS ABS: 0.1 10*3/uL (ref 0.0–0.1)
BASOS PCT: 1 % (ref 0–1)
EOS ABS: 0.3 10*3/uL (ref 0.0–0.7)
EOS PCT: 3 % (ref 0–5)
HCT: 44.6 % (ref 39.0–52.0)
Hemoglobin: 15 g/dL (ref 13.0–17.0)
Lymphocytes Relative: 31 % (ref 12–46)
Lymphs Abs: 2.9 10*3/uL (ref 0.7–4.0)
MCH: 30 pg (ref 26.0–34.0)
MCHC: 33.6 g/dL (ref 30.0–36.0)
MCV: 89.2 fL (ref 78.0–100.0)
Monocytes Absolute: 0.7 10*3/uL (ref 0.1–1.0)
Monocytes Relative: 8 % (ref 3–12)
Neutro Abs: 5.6 10*3/uL (ref 1.7–7.7)
Neutrophils Relative %: 58 % (ref 43–77)
PLATELETS: 271 10*3/uL (ref 150–400)
RBC: 5 MIL/uL (ref 4.22–5.81)
RDW: 13.4 % (ref 11.5–15.5)
WBC: 9.6 10*3/uL (ref 4.0–10.5)

## 2014-02-20 LAB — RAPID URINE DRUG SCREEN, HOSP PERFORMED
Amphetamines: NOT DETECTED
BARBITURATES: NOT DETECTED
BENZODIAZEPINES: NOT DETECTED
COCAINE: NOT DETECTED
Opiates: NOT DETECTED
Tetrahydrocannabinol: POSITIVE — AB

## 2014-02-20 MED ORDER — SODIUM CHLORIDE 0.9 % IV SOLN
1000.0000 mL | Freq: Once | INTRAVENOUS | Status: AC
  Administered 2014-02-20: 1000 mL via INTRAVENOUS

## 2014-02-20 MED ORDER — SODIUM CHLORIDE 0.9 % IV SOLN
1000.0000 mL | INTRAVENOUS | Status: DC
Start: 2014-02-20 — End: 2014-02-21

## 2014-02-20 MED ORDER — FENTANYL CITRATE 0.05 MG/ML IJ SOLN
25.0000 ug | Freq: Once | INTRAMUSCULAR | Status: AC
  Administered 2014-02-20: 25 ug via INTRAVENOUS
  Filled 2014-02-20: qty 2

## 2014-02-20 MED ORDER — PROMETHAZINE HCL 25 MG PO TABS: 25.0000 mg | ORAL_TABLET | Freq: Three times a day (TID) | ORAL | Status: DC | PRN

## 2014-02-20 MED ORDER — ONDANSETRON HCL 4 MG/2ML IJ SOLN
4.0000 mg | Freq: Once | INTRAMUSCULAR | Status: AC
  Administered 2014-02-20: 4 mg via INTRAVENOUS
  Filled 2014-02-20: qty 2

## 2014-02-20 NOTE — ED Notes (Signed)
abd pain, blood in stool x2,  Vomited x2, .  Has appt for colonscopy 4/14.  Burning sensation from throat.to stomach, Hx of GERD

## 2014-02-20 NOTE — Discharge Instructions (Signed)
Drink plenty of fluids. YOU NEED TO SEE YOUR DOCTOR TO STAY ON YOUR CHRONIC PAIN MEDICATIONS!  Use the phenergan for nausea or vomiting. Take imodium OTC for diarrhea.   Chronic Pain Discharge Instructions  Emergency care providers appreciate that many patients coming to us are in severe pain and we wish to address their pain in the safest, most responsible manner.  It is important to recognize however, that the proper treatment of chronic pain differs from that of the pain of injuries and acute illnesses.  Our goal is to provide quality, safe, personalized care and we thank you for giving us the opportunity to serve you. The use of narcotics and related agents for chronic pain syndromes may lead to additional physical and psychological problems.  Nearly as many people die from prescription narcotics each year as die from car crashes.  Additionally, this risk is increased if such prescriptions are obtained from a variety of sources.  Therefore, only your primary care physician or a pain management specialist is able to safely treat such syndromes with narcotic medications long-term.    Documentation revealing such prescriptions have been sought from multiple sources may prohibit us from providing a refill or different narcotic medication.  Your name may be checked first through the [facility] [place] Controlled Substances Reporting System.  This database is a record of controlled substance medication prescriptions that the patient has received.  This has been established by [facility] in an effort to eliminate the dangerous, and often life threatening, practice of obtaining multiple prescriptions from different medical providers.   If you have a chronic pain syndrome (i.e. chronic headaches, recurrent back or neck pain, dental pain, abdominal or pelvis pain without a specific diagnosis, or neuropathic pain such as fibromyalgia) or recurrent visits for the same condition without an acute diagnosis, you may be  treated with non-narcotics and other non-addictive medicines.  Allergic reactions or negative side effects that may be reported by a patient to such medications will not typically lead to the use of a narcotic analgesic or other controlled substance as an alternative.   Patients managing chronic pain with a personal physician should have provisions in place for breakthrough pain.  If you are in crisis, you should call your physician.  If your physician directs you to the emergency department, please have the doctor call and speak to our attending physician concerning your care.   When patients come to the Emergency Department (ED) with acute medical conditions in which the Emergency Department physician feels appropriate to prescribe narcotic or sedating pain medication, the physician will prescribe these in very limited quantities.  The amount of these medications will last only until you can see your primary care physician in his/her office.  Any patient who returns to the ED seeking refills should expect only non-narcotic pain medications.   In the event of an acute medical condition exists and the emergency physician feels it is necessary that the patient be given a narcotic or sedating medication -  a responsible adult driver should be present in the room prior to the medication being given by the nurse.   Prescriptions for narcotic or sedating medications that have been lost, stolen or expired will not be refilled in the Emergency Department.    Patients who have chronic pain may receive non-narcotic prescriptions until seen by their primary care physician.  It is every patients personal responsibility to maintain active prescriptions with his or her primary care physician or specialist.

## 2014-02-20 NOTE — ED Provider Notes (Signed)
CSN: [id number]     Arrival date & time 02/20/14  1622 History   First MD Initiated Contact with Patient 02/20/14 1716     Chief Complaint  Patient presents with  . Abdominal Pain     (Consider location/radiation/quality/duration/timing/severity/associated sxs/prior Treatment) HPI Patient reports 2 days ago he started getting some lower diffuse abdominal pain. He states he had vomiting 3 times today and 4 times yesterday. He states he's had about 8 episodes of watery diarrhea today since 4 AM. He states twice he saw a cervical blood in the bottom of the toilet water. He denies any fever. He states he feels weak and feels dizzy. He states he has normal urination. Nothing makes his stomach hurt more, nothing makes it feel better. He states he had eaten pork chops that he cooked himself prior to this starting. He states other people ate the same food and no one else is sick. Patient states he was seen in the ED 2 weeks ago for the same thing. He states he had discomfort for 4 days. He had CT scan done at that time. He was prescribed Norco, and Imodium. He was also given a prescription for urinary tract infection. He states he took Imodium once today at 7 AM. Patient states he ran out of his Nexium yesterday. So he only missed his dose today. Patient states he takes Norco 2 times daily for chronic back and neck pain. He states when he was seen on March 11 he had run out of his Norco. He states he ran out of his Norco) a few days ago". He states he needed to have his medication refilled in March 19 however he waited until this afternoon to call his doctor's office which was closed.  PCP Dr Lysbeth GalasNyland  Past Medical History  Diagnosis Date  . Bronchitis   . GERD (gastroesophageal reflux disease)   . Graves disease   . Depression   . Anxiety    Past Surgical History  Procedure Laterality Date  . Appendectomy     Family History  Problem Relation Age of Onset  . Stroke Mother    History  Substance  Use Topics  . Smoking status: Current Every Day Smoker -- 1.00 packs/day for 35 years    Types: Cigarettes  . Smokeless tobacco: Never Used  . Alcohol Use: 7.2 oz/week    12 Cans of beer per week     Comment: daily  + THC  Review of Systems  All other systems reviewed and are negative.      Allergies  Penicillins  Home Medications   Current Outpatient Rx  Name  Route  Sig  Dispense  Refill  . ALPRAZolam (XANAX) 1 MG tablet   Oral   Take 1-2 mg by mouth 2 (two) times daily as needed for anxiety or sleep.          Marland Kitchen. esomeprazole (NEXIUM) 20 MG capsule   Oral   Take 20 mg by mouth daily.         Marland Kitchen. HYDROcodone-acetaminophen (NORCO/VICODIN) 5-325 MG per tablet   Oral   Take 1 tablet by mouth 3 (three) times daily as needed for moderate pain.          Marland Kitchen. loperamide (IMODIUM A-D) 2 MG tablet   Oral   Take 1 tablet (2 mg total) by mouth 4 (four) times daily as needed for diarrhea or loose stools.   30 tablet   0   . naproxen sodium (ALEVE) 220  MG tablet   Oral   Take 220 mg by mouth daily as needed (for arthritis pain).         . promethazine (PHENERGAN) 25 MG tablet   Oral   Take 1 tablet (25 mg total) by mouth every 8 (eight) hours as needed for nausea or vomiting.   8 tablet   0    BP 112/78  Pulse 88  Temp(Src) 98 F (36.7 C) (Oral)  Resp 18  Ht 5\' 8"  (1.727 m)  Wt 130 lb (58.968 kg)  BMI 19.77 kg/m2  SpO2 97%  Vital signs normal   Physical Exam  Nursing note and vitals reviewed. Constitutional: He is oriented to person, place, and time. He appears well-developed and well-nourished.  Non-toxic appearance. He does not appear ill. No distress.  HENT:  Head: Normocephalic and atraumatic.  Right Ear: External ear normal.  Left Ear: External ear normal.  Nose: Nose normal. No mucosal edema or rhinorrhea.  Mouth/Throat: Oropharynx is clear and moist and mucous membranes are normal. No dental abscesses or uvula swelling.  Eyes: Conjunctivae and EOM  are normal. Pupils are equal, round, and reactive to light.  Neck: Normal range of motion and full passive range of motion without pain. Neck supple.  Cardiovascular: Normal rate, regular rhythm and normal heart sounds.  Exam reveals no gallop and no friction rub.   No murmur heard. Pulmonary/Chest: Effort normal and breath sounds normal. No respiratory distress. He has no wheezes. He has no rhonchi. He has no rales. He exhibits no tenderness and no crepitus.  Abdominal: Soft. Normal appearance and bowel sounds are normal. He exhibits no distension. There is no tenderness. There is no rebound and no guarding.  Musculoskeletal: Normal range of motion. He exhibits no edema and no tenderness.  Moves all extremities well.   Neurological: He is alert and oriented to person, place, and time. He has normal strength. No cranial nerve deficit.  Skin: Skin is warm, dry and intact. No rash noted. No erythema. No pallor.  Psychiatric: He has a normal mood and affect. His speech is normal and behavior is normal. His mood appears not anxious.    ED Course  Procedures (including critical care time)  Medications  0.9 %  sodium chloride infusion (1,000 mLs Intravenous New Bag/Given 02/20/14 1837)    Followed by  0.9 %  sodium chloride infusion (0 mLs Intravenous Stopped 02/20/14 2014)  fentaNYL (SUBLIMAZE) injection 25 mcg (25 mcg Intravenous Given 02/20/14 1908)  ondansetron (ZOFRAN) injection 4 mg (4 mg Intravenous Given 02/20/14 1911)   Review of the [facility] database shows patient got #60 Norco 5/325 on February 19. He then got the Norco from the ED on March 11.  I reviewed the patient's CT scan from 2 weeks ago which showed diverticulosis but no diverticulitis. His labs today are totally normal. I discussed with patient he keeps going through narcotic withdrawal. He was advised he would need to see his PCP to get back on his chronic pain medication. He has had 2 ED visits in the past 2 weeks for the  same thing.  Labs Review Results for orders placed during the hospital encounter of 02/20/14  CBC WITH DIFFERENTIAL      Result Value Ref Range   WBC 9.6  4.0 - 10.5 K/uL   RBC 5.00  4.22 - 5.81 MIL/uL   Hemoglobin 15.0  13.0 - 17.0 g/dL   HCT 16.1  09.6 - 04.5 %   MCV 89.2  78.0 - 100.0 fL   MCH 30.0  26.0 - 34.0 pg   MCHC 33.6  30.0 - 36.0 g/dL   RDW 40.9  81.1 - 91.4 %   Platelets 271  150 - 400 K/uL   Neutrophils Relative % 58  43 - 77 %   Neutro Abs 5.6  1.7 - 7.7 K/uL   Lymphocytes Relative 31  12 - 46 %   Lymphs Abs 2.9  0.7 - 4.0 K/uL   Monocytes Relative 8  3 - 12 %   Monocytes Absolute 0.7  0.1 - 1.0 K/uL   Eosinophils Relative 3  0 - 5 %   Eosinophils Absolute 0.3  0.0 - 0.7 K/uL   Basophils Relative 1  0 - 1 %   Basophils Absolute 0.1  0.0 - 0.1 K/uL  COMPREHENSIVE METABOLIC PANEL      Result Value Ref Range   Sodium 142  137 - 147 mEq/L   Potassium 4.2  3.7 - 5.3 mEq/L   Chloride 104  96 - 112 mEq/L   CO2 22  19 - 32 mEq/L   Glucose, Bld 86  70 - 99 mg/dL   BUN 7  6 - 23 mg/dL   Creatinine, Ser 7.82  0.50 - 1.35 mg/dL   Calcium 9.0  8.4 - 95.6 mg/dL   Total Protein 7.5  6.0 - 8.3 g/dL   Albumin 3.8  3.5 - 5.2 g/dL   AST 18  0 - 37 U/L   ALT 13  0 - 53 U/L   Alkaline Phosphatase 92  39 - 117 U/L   Total Bilirubin <0.2 (*) 0.3 - 1.2 mg/dL   GFR calc non Af Amer >90  >90 mL/min   GFR calc Af Amer >90  >90 mL/min  URINALYSIS, ROUTINE W REFLEX MICROSCOPIC      Result Value Ref Range   Color, Urine YELLOW  YELLOW   APPearance CLEAR  CLEAR   Specific Gravity, Urine <1.005 (*) 1.005 - 1.030   pH 6.0  5.0 - 8.0   Glucose, UA NEGATIVE  NEGATIVE mg/dL   Hgb urine dipstick NEGATIVE  NEGATIVE   Bilirubin Urine NEGATIVE  NEGATIVE   Ketones, ur NEGATIVE  NEGATIVE mg/dL   Protein, ur NEGATIVE  NEGATIVE mg/dL   Urobilinogen, UA 0.2  0.0 - 1.0 mg/dL   Nitrite NEGATIVE  NEGATIVE   Leukocytes, UA NEGATIVE  NEGATIVE  URINE RAPID DRUG SCREEN (HOSP PERFORMED)       Result Value Ref Range   Opiates NONE DETECTED  NONE DETECTED   Cocaine NONE DETECTED  NONE DETECTED   Benzodiazepines NONE DETECTED  NONE DETECTED   Amphetamines NONE DETECTED  NONE DETECTED   Tetrahydrocannabinol POSITIVE (*) NONE DETECTED   Barbiturates NONE DETECTED  NONE DETECTED   Laboratory interpretation all normal except non concentrated urine, + UDS for [facility]   Dg Chest 2 View  02/04/2014   CLINICAL DATA Pain with nausea  EXAM CHEST  2 VIEW  COMPARISON Mar 31, 2013  FINDINGS Lungs are slightly hyperexpanded but clear. Heart size and pulmonary vascularity are normal. No adenopathy. No bone lesions.  IMPRESSION No edema or consolidation.  Lungs mildly hyperexpanded.  SIGNATURE  Electronically Signed   By: Bretta Bang M.D.   On: 02/04/2014 09:09   Ct Abdomen Pelvis W Contrast  02/04/2014   CLINICAL DATA Abdominal pain  EXAM CT ABDOMEN AND PELVIS WITH CONTRAST  TECHNIQUE Multidetector CT imaging of the abdomen and pelvis was performed  using the standard protocol following bolus administration of intravenous contrast.  CONTRAST OMNIPAQUE IOHEXOL 300 MG/ML SOLN, 25mL OMNIPAQUE IOHEXOL 300 MG/ML SOLN  COMPARISON None.  FINDINGS Lung bases are clear with mild scarring or atelectasis in the right lower lobe.  Liver gallbladder and bile ducts are normal. Pancreas and spleen are normal. Kidneys are normal without obstruction or stone or mass.  Negative for bowel obstruction . Appendectomy clips are noted. Sigmoid diverticulosis is present. No free fluid.  Large cyst in the subcutaneous tissues to the right buttocks. This measures 6.8 x 2.7 cm. This may be a benign cyst or possibly a chronic hematoma. The surrounding fat appears normal.  L5-S1 spondylosis.  No acute fracture.  IMPRESSION Sigmoid diverticulosis without evidence of acute diverticulitis.  Prior appendectomy.  Benign appearing large cyst in the right buttock.  No acute abnormality.  SIGNATURE  Electronically Signed   By: Marlan Palau M.D.   On: 02/04/2014 09:44     Imaging Review No results found.   EKG Interpretation None      MDM   Final diagnoses:  Vomiting and diarrhea  Narcotic withdrawal    New Prescriptions   PROMETHAZINE (PHENERGAN) 25 MG TABLET    Take 1 tablet (25 mg total) by mouth every 8 (eight) hours as needed for nausea or vomiting.    Plan discharge   Devoria Albe, MD, Franz Dell, MD 02/20/14 2032

## 2014-03-10 ENCOUNTER — Encounter (INDEPENDENT_AMBULATORY_CARE_PROVIDER_SITE_OTHER): Payer: Self-pay | Admitting: *Deleted

## 2014-03-10 ENCOUNTER — Other Ambulatory Visit (INDEPENDENT_AMBULATORY_CARE_PROVIDER_SITE_OTHER): Payer: Self-pay | Admitting: *Deleted

## 2014-03-10 ENCOUNTER — Ambulatory Visit (INDEPENDENT_AMBULATORY_CARE_PROVIDER_SITE_OTHER): Payer: Self-pay | Admitting: Internal Medicine

## 2014-03-10 ENCOUNTER — Encounter (INDEPENDENT_AMBULATORY_CARE_PROVIDER_SITE_OTHER): Payer: Self-pay | Admitting: Internal Medicine

## 2014-03-10 VITALS — BP 107/72 | HR 76 | Temp 98.4°F | Ht 68.0 in | Wt 124.0 lb

## 2014-03-10 DIAGNOSIS — K219 Gastro-esophageal reflux disease without esophagitis: Secondary | ICD-10-CM | POA: Insufficient documentation

## 2014-03-10 DIAGNOSIS — R109 Unspecified abdominal pain: Secondary | ICD-10-CM

## 2014-03-10 DIAGNOSIS — K921 Melena: Secondary | ICD-10-CM

## 2014-03-10 DIAGNOSIS — R197 Diarrhea, unspecified: Secondary | ICD-10-CM | POA: Insufficient documentation

## 2014-03-10 DIAGNOSIS — E0500 Thyrotoxicosis with diffuse goiter without thyrotoxic crisis or storm: Secondary | ICD-10-CM | POA: Insufficient documentation

## 2014-03-10 DIAGNOSIS — F329 Major depressive disorder, single episode, unspecified: Secondary | ICD-10-CM | POA: Insufficient documentation

## 2014-03-10 DIAGNOSIS — F339 Major depressive disorder, recurrent, unspecified: Secondary | ICD-10-CM | POA: Insufficient documentation

## 2014-03-10 NOTE — Progress Notes (Signed)
Subjective:     Patient ID: Jason Lamb, male   DOB: 04/23/1961, 53 y.o.   MRN: [id number]  HPI Referred to our office by Dr. Joette CatchingLeonard Nyland for a screening colonoscopy.  He tells me he has mid abdominal pain. He says his stools are loose and they are black. He has seen bright red blood after a BM in the toilet.  He says he has been taking Pepto Bismol for the diarrhea.. Stools were black before the Pepto Bismol.   He is having 2-3 loose stools a day.  He has had diarrhea off and on for 2 months. No recent antibiotics.  He has not taken Imodium because he became constipated. He says he has had a black stool daily x 2 weeks.  Acid reflux is controlled for the most part with Nexium. Appetite is okay. He thinks he may have lost about 6 pounds. Normal weight is 130.  Seen by Dr. Lysbeth GalasNyland in March for diarrhea that occurred after meals. Given an Rx for Imodum as needed.   Takes Aleve once a week for migraines  02/04/2014 H and H 14.5 and 42.0. MCV 88.4,  NA 141, K 4.7, BUN 9, Creatinine 0.72, AST 24, AalT 17, ALP 96.  LIpase 64.  02/04/2014 CT abdomen/pelvis with CM:  IMPRESSION  Sigmoid diverticulosis without evidence of acute diverticulitis.  Prior appendectomy.  Benign appearing large cyst in the right buttock.  No acute abnormality.   Review of Systems Past Medical History  Diagnosis Date  . Bronchitis   . GERD (gastroesophageal reflux disease)   . Graves disease   . Depression   . Anxiety     Past Surgical History  Procedure Laterality Date  . Appendectomy      Allergies  Allergen Reactions  . Penicillins Other (See Comments)    Childhood allergy    Current Outpatient Prescriptions on File Prior to Visit  Medication Sig Dispense Refill  . ALPRAZolam (XANAX) 1 MG tablet Take 1-2 mg by mouth at bedtime.       Marland Kitchen. esomeprazole (NEXIUM) 20 MG capsule Take 20 mg by mouth daily.      Marland Kitchen. HYDROcodone-acetaminophen (NORCO/VICODIN) 5-325 MG per tablet Take 1 tablet by mouth 3 (three)  times daily as needed for moderate pain.       . naproxen sodium (ALEVE) 220 MG tablet Take 220 mg by mouth daily as needed (for arthritis pain).       No current facility-administered medications on file prior to visit.  Divorced. Three health choldren,      Objective:   Physical Exam  Filed Vitals:   03/10/14 1035  BP: 107/72  Pulse: 76  Temp: 98.4 F (36.9 C)  Height: 5\' 8"  (1.727 m)  Weight: 124 lb (56.246 kg)  Alert and oriented. Skin warm and dry. Oral mucosa is moist.   . Sclera anicteric, conjunctivae is pink. Thyroid not enlarged. No cervical lymphadenopathy. Lungs clear. Heart regular rate and rhythm.  Abdomen is soft. Bowel sounds are positive. No hepatomegaly. No abdominal masses felt. Tenderness to mid abdomen..  No edema to lower extremities.  Stool brown and guaiac negative.      Assessment:    Diarrhea. ? Etiology. He usually drinks 12 beers a day. Diarrhea possible from drinking. He has never undergone a colonoscopy in the past. Melena: Stool was negative today in office and brown.  PUD needs to be ruled out.     Plan:   EGD/Colonoscopy. The risks and benefits such as  perforation, bleeding, and infection were reviewed with the patient and is agreeable.

## 2014-03-10 NOTE — Patient Instructions (Signed)
EGD/Colonoscopy. The risks and benefits such as perforation, bleeding, and infection were reviewed with the patient and is agreeable. 

## 2014-03-24 ENCOUNTER — Encounter (HOSPITAL_COMMUNITY): Payer: Self-pay | Admitting: Pharmacy Technician

## 2014-03-24 ENCOUNTER — Encounter (HOSPITAL_COMMUNITY): Payer: Self-pay | Admitting: Emergency Medicine

## 2014-03-24 ENCOUNTER — Emergency Department (HOSPITAL_COMMUNITY)
Admission: EM | Admit: 2014-03-24 | Discharge: 2014-03-24 | Disposition: A | Discharge status: Home / Self Care | Payer: Self-pay | Attending: Emergency Medicine | Admitting: Emergency Medicine

## 2014-03-24 DIAGNOSIS — R109 Unspecified abdominal pain: Secondary | ICD-10-CM | POA: Insufficient documentation

## 2014-03-24 DIAGNOSIS — R6883 Chills (without fever): Secondary | ICD-10-CM | POA: Insufficient documentation

## 2014-03-24 DIAGNOSIS — K219 Gastro-esophageal reflux disease without esophagitis: Secondary | ICD-10-CM | POA: Insufficient documentation

## 2014-03-24 DIAGNOSIS — F17200 Nicotine dependence, unspecified, uncomplicated: Secondary | ICD-10-CM | POA: Insufficient documentation

## 2014-03-24 DIAGNOSIS — F3289 Other specified depressive episodes: Secondary | ICD-10-CM | POA: Insufficient documentation

## 2014-03-24 DIAGNOSIS — R112 Nausea with vomiting, unspecified: Secondary | ICD-10-CM | POA: Insufficient documentation

## 2014-03-24 DIAGNOSIS — Z880 Allergy status to penicillin: Secondary | ICD-10-CM | POA: Insufficient documentation

## 2014-03-24 DIAGNOSIS — Z79899 Other long term (current) drug therapy: Secondary | ICD-10-CM | POA: Insufficient documentation

## 2014-03-24 DIAGNOSIS — Z862 Personal history of diseases of the blood and blood-forming organs and certain disorders involving the immune mechanism: Secondary | ICD-10-CM | POA: Insufficient documentation

## 2014-03-24 DIAGNOSIS — Z8709 Personal history of other diseases of the respiratory system: Secondary | ICD-10-CM | POA: Insufficient documentation

## 2014-03-24 DIAGNOSIS — F411 Generalized anxiety disorder: Secondary | ICD-10-CM | POA: Insufficient documentation

## 2014-03-24 DIAGNOSIS — F329 Major depressive disorder, single episode, unspecified: Secondary | ICD-10-CM | POA: Insufficient documentation

## 2014-03-24 DIAGNOSIS — R05 Cough: Secondary | ICD-10-CM | POA: Insufficient documentation

## 2014-03-24 DIAGNOSIS — R059 Cough, unspecified: Secondary | ICD-10-CM | POA: Insufficient documentation

## 2014-03-24 DIAGNOSIS — Z8639 Personal history of other endocrine, nutritional and metabolic disease: Secondary | ICD-10-CM | POA: Insufficient documentation

## 2014-03-24 DIAGNOSIS — R197 Diarrhea, unspecified: Secondary | ICD-10-CM | POA: Insufficient documentation

## 2014-03-24 DIAGNOSIS — R0989 Other specified symptoms and signs involving the circulatory and respiratory systems: Secondary | ICD-10-CM | POA: Insufficient documentation

## 2014-03-24 LAB — CBC WITH DIFFERENTIAL/PLATELET
Basophils Absolute: 0.1 10*3/uL (ref 0.0–0.1)
Basophils Relative: 0 % (ref 0–1)
EOS ABS: 0.1 10*3/uL (ref 0.0–0.7)
Eosinophils Relative: 1 % (ref 0–5)
HCT: 45.7 % (ref 39.0–52.0)
Hemoglobin: 16.4 g/dL (ref 13.0–17.0)
LYMPHS PCT: 15 % (ref 12–46)
Lymphs Abs: 1.9 10*3/uL (ref 0.7–4.0)
MCH: 30.8 pg (ref 26.0–34.0)
MCHC: 35.9 g/dL (ref 30.0–36.0)
MCV: 85.9 fL (ref 78.0–100.0)
MONOS PCT: 10 % (ref 3–12)
Monocytes Absolute: 1.2 10*3/uL — ABNORMAL HIGH (ref 0.1–1.0)
NEUTROS PCT: 74 % (ref 43–77)
Neutro Abs: 9.3 10*3/uL — ABNORMAL HIGH (ref 1.7–7.7)
Platelets: 334 10*3/uL (ref 150–400)
RBC: 5.32 MIL/uL (ref 4.22–5.81)
RDW: 13.6 % (ref 11.5–15.5)
WBC: 12.6 10*3/uL — ABNORMAL HIGH (ref 4.0–10.5)

## 2014-03-24 LAB — COMPREHENSIVE METABOLIC PANEL
ALK PHOS: 82 U/L (ref 39–117)
ALT: 48 U/L (ref 0–53)
AST: 68 U/L — ABNORMAL HIGH (ref 0–37)
Albumin: 4.6 g/dL (ref 3.5–5.2)
BILIRUBIN TOTAL: 0.4 mg/dL (ref 0.3–1.2)
BUN: 6 mg/dL (ref 6–23)
CHLORIDE: 95 meq/L — AB (ref 96–112)
CO2: 20 meq/L (ref 19–32)
Calcium: 9.6 mg/dL (ref 8.4–10.5)
Creatinine, Ser: 0.74 mg/dL (ref 0.50–1.35)
GFR calc non Af Amer: >90 mL/min (ref >90)
GLUCOSE: 87 mg/dL (ref 70–99)
POTASSIUM: 4.3 meq/L (ref 3.7–5.3)
Sodium: 136 mEq/L — ABNORMAL LOW (ref 137–147)
TOTAL PROTEIN: 7.8 g/dL (ref 6.0–8.3)

## 2014-03-24 LAB — LIPASE, BLOOD: Lipase: 65 U/L — ABNORMAL HIGH (ref 11–59)

## 2014-03-24 LAB — I-STAT CG4 LACTIC ACID, ED
Lactic Acid, Venous: 3.65 mmol/L — ABNORMAL HIGH (ref 0.5–2.2)
Lactic Acid, Venous: 2.16 mmol/L (ref 0.5–2.2)

## 2014-03-24 LAB — I-STAT TROPONIN, ED: Troponin i, poc: 0 ng/mL (ref 0.00–0.08)

## 2014-03-24 MED ORDER — ONDANSETRON HCL 4 MG/2ML IJ SOLN
INTRAMUSCULAR | Status: AC
  Filled 2014-03-24: qty 2

## 2014-03-24 MED ORDER — SODIUM CHLORIDE 0.9 % IV BOLUS (SEPSIS)
1000.0000 mL | Freq: Once | INTRAVENOUS | Status: AC
  Administered 2014-03-24: 1000 mL via INTRAVENOUS

## 2014-03-24 MED ORDER — ONDANSETRON HCL 4 MG/2ML IJ SOLN
4.0000 mg | Freq: Once | INTRAMUSCULAR | Status: AC
Start: 2014-03-24 — End: 2014-03-24
  Administered 2014-03-24: 4 mg via INTRAVENOUS
  Filled 2014-03-24: qty 2

## 2014-03-24 MED ORDER — PANTOPRAZOLE SODIUM 40 MG IV SOLR
40.0000 mg | Freq: Once | INTRAVENOUS | Status: AC
  Administered 2014-03-24: 40 mg via INTRAVENOUS
  Filled 2014-03-24: qty 40

## 2014-03-24 MED ORDER — MORPHINE SULFATE 4 MG/ML IJ SOLN
4.0000 mg | Freq: Once | INTRAMUSCULAR | Status: AC
  Administered 2014-03-24: 4 mg via INTRAVENOUS
  Filled 2014-03-24: qty 1

## 2014-03-24 MED ORDER — ONDANSETRON HCL 8 MG PO TABS: 8.0000 mg | ORAL_TABLET | Freq: Three times a day (TID) | ORAL | Status: DC | PRN

## 2014-03-24 MED ORDER — ONDANSETRON HCL 4 MG/2ML IJ SOLN
4.0000 mg | Freq: Once | INTRAMUSCULAR | Status: AC
  Administered 2014-03-24: 4 mg via INTRAVENOUS

## 2014-03-24 MED ORDER — ONDANSETRON HCL 4 MG/2ML IJ SOLN
4.0000 mg | Freq: Once | INTRAMUSCULAR | Status: AC
  Administered 2014-03-24: 4 mg via INTRAVENOUS
  Filled 2014-03-24: qty 2

## 2014-03-24 NOTE — ED Notes (Signed)
Pt c/o n/v x 5 days.

## 2014-03-24 NOTE — ED Provider Notes (Signed)
CSN: [id number]     Arrival date & time 03/24/14  [id number] History   First MD Initiated Contact with Patient 03/24/14 0325     Chief Complaint  Patient presents with  . Vomiting     Patient is a 53 y.o. male presenting with vomiting. The history is provided by the patient.  Emesis Severity:  Moderate Duration:  5 days Timing:  Intermittent Number of daily episodes:  2 Emesis appearance: watery. Progression:  Worsening Chronicity:  Recurrent Relieved by:  None tried Worsened by:  Liquids Associated symptoms: abdominal pain, chills, cough and diarrhea   Associated symptoms: no fever   Risk factors: alcohol use   pt reports nonbloody  vomiting for 5 days, usually worse at night He also endorses continued diarrhea (no bloody stools reported) that has been treated previously.  He will take pepto bismol at times for his diarrhea  He also reports chest burning with vomiting   He reports he has had this intermittently.  He admits to ETOH use tonight (3 or 4 beers per patient) He reports due to vomiting he has been unable to take his xanax/hydrocodone    Past Medical History  Diagnosis Date  . Bronchitis   . GERD (gastroesophageal reflux disease)   . Graves disease   . Depression   . Anxiety    Past Surgical History  Procedure Laterality Date  . Appendectomy     History reviewed. No pertinent family history. History  Substance Use Topics  . Smoking status: Current Every Day Smoker -- 1.00 packs/day for 35 years    Types: Cigarettes  . Smokeless tobacco: Never Used     Comment: 1 pack a day  . Alcohol Use: 7.2 oz/week    12 Cans of beer per week     Comment: daily x 12 beers a day usually.    Review of Systems  Constitutional: Positive for chills.  Respiratory: Positive for cough.   Cardiovascular:       Chest burning   Gastrointestinal: Positive for vomiting, abdominal pain and diarrhea. Negative for blood in stool.  Genitourinary: Negative for dysuria.   Neurological: Negative for syncope.  All other systems reviewed and are negative.     Allergies  Penicillins  Home Medications   Prior to Admission medications   Medication Sig Start Date End Date Taking? Authorizing Provider  esomeprazole (NEXIUM) 20 MG capsule Take 20 mg by mouth daily.   Yes Historical Provider, MD  naproxen sodium (ALEVE) 220 MG tablet Take 220 mg by mouth daily as needed (for arthritis pain).   Yes Historical Provider, MD  ALPRAZolam Prudy Feeler(XANAX) 1 MG tablet Take 1-2 mg by mouth at bedtime.     Historical Provider, MD  HYDROcodone-acetaminophen (NORCO/VICODIN) 5-325 MG per tablet Take 1 tablet by mouth 3 (three) times daily as needed for moderate pain.     Historical Provider, MD   BP 130/80  Pulse 89  Temp(Src) 98.4 F (36.9 C) (Oral)  Resp 20  Ht 5\' 8"  (1.727 m)  Wt 125 lb (56.7 kg)  BMI 19.01 kg/m2  SpO2 99% Physical Exam CONSTITUTIONAL: disheveled HEAD: Normocephalic/atraumatic EYES: EOMI/PERRL, no icterus ENMT: Mucous membranes moist NECK: supple no meningeal signs SPINE:entire spine nontender CV: S1/S2 noted, no murmurs/rubs/gallops noted LUNGS: Lungs are clear to auscultation bilaterally, no apparent distress ABDOMEN: soft, mild epigastric tenderness, no rebound or guarding GU:no cva tenderness NEURO: Pt is awake/alert, moves all extremitiesx4 EXTREMITIES: pulses normal, full ROM SKIN: warm, color normal PSYCH: no abnormalities of  mood noted  ED Course  Procedures  3:42 AM Pt presents for recurrent episode of vomiting He has had this previously and had two evaluations in March 2015 Since he has seen GI specialist and scheduled for EGD/Colonoscopy in May Will obtain labs to evaluate for dehydrated He admits to ETOH use but is not clinically intoxicated 4:26 AM Pt resting comfortable.  He is watching TV His abd is soft to palpation His lactate is elevated but I suspect may be related to his dehydration rather than an acute abdominal  emergency Will rehydrate and reassess.   6:08 AM Lactate has improved On exam, his abdomen is soft without focal tenderness He appears well, watching TV BP 138/86  Pulse 94  Temp(Src) 98.9 F (37.2 C) (Oral)  Resp 17  Ht 5\' 8"  (1.727 m)  Wt 125 lb (56.7 kg)  BMI 19.01 kg/m2  SpO2 96% I feel he is safe/stable for d/c home.  He has had these episodes previously and already has GI followup arranged He has been able to take PO here I don't feel emergent CT imaging required Advised ETOH use is contributing to his symptoms   Labs Review Labs Reviewed  CBC WITH DIFFERENTIAL - Abnormal; Notable for the following:    WBC 12.6 (*)    Neutro Abs 9.3 (*)    Monocytes Absolute 1.2 (*)    All other components within normal limits  COMPREHENSIVE METABOLIC PANEL - Abnormal; Notable for the following:    Sodium 136 (*)    Chloride 95 (*)    AST 68 (*)    All other components within normal limits  LIPASE, BLOOD - Abnormal; Notable for the following:    Lipase 65 (*)    All other components within normal limits  I-STAT CG4 LACTIC ACID, ED - Abnormal; Notable for the following:    Lactic Acid, Venous 3.65 (*)    All other components within normal limits  I-STAT TROPOININ, ED  I-STAT CG4 LACTIC ACID, ED      EKG Interpretation   Date/Time:  Tuesday March 24 2014 03:29:06 EDT Ventricular Rate:  88 PR Interval:  136 QRS Duration: 78 QT Interval:  368 QTC Calculation: 445 R Axis:   77 Text Interpretation:  Normal sinus rhythm with sinus arrhythmia Cannot  rule out Anterior infarct , age undetermined Abnormal ECG When compared  with ECG of 31-Mar-2013 18:43, Premature ventricular complexes are no  longer Present Confirmed by Bebe ShaggyWICKLINE  MD, Dorinda HillNALD ([id number]) on 03/24/2014  3:35:40 AM      MDM   Final diagnoses:  Nausea and vomiting  Abdominal pain    Nursing notes including past medical history and social history reviewed and considered in documentation Labs/vital reviewed and  considered Previous records reviewed and considered     Joya Gaskinsonald W Lorree Millar, MD 03/24/14 [phone]

## 2014-03-24 NOTE — ED Notes (Signed)
Pt was seen here for the same and dx with withdrawals. Pt smells of etoh.

## 2014-03-24 NOTE — Discharge Instructions (Signed)
°  SEEK IMMEDIATE MEDICAL ATTENTION IF: The pain does not go away or becomes severe, particularly over the next 8-12 hours.  A temperature above 100.35F develops.  The pain becomes localized to portions of the abdomen.  In an adult, the left lower portion of the abdomen could be colitis or diverticulitis.  Blood is being passed in stools or vomit (bright red or black tarry stools).  Return also if you develop chest pain, difficulty breathing, dizziness or fainting, or become confused, poorly responsive, or inconsolable.

## 2014-04-08 ENCOUNTER — Ambulatory Visit (HOSPITAL_COMMUNITY)
Admission: RE | Admit: 2014-04-08 | Discharge: 2014-04-08 | Disposition: A | Discharge status: Home / Self Care | Payer: Self-pay | Source: Ambulatory Visit | Attending: Internal Medicine | Admitting: Internal Medicine

## 2014-04-08 ENCOUNTER — Encounter (HOSPITAL_COMMUNITY): Payer: Self-pay | Admitting: *Deleted

## 2014-04-08 ENCOUNTER — Encounter (HOSPITAL_COMMUNITY)
Admission: RE | Disposition: A | Discharge status: Home / Self Care | Payer: Self-pay | Source: Ambulatory Visit | Attending: Internal Medicine

## 2014-04-08 DIAGNOSIS — K2100 Gastro-esophageal reflux disease with esophagitis, without bleeding: Secondary | ICD-10-CM | POA: Insufficient documentation

## 2014-04-08 DIAGNOSIS — F329 Major depressive disorder, single episode, unspecified: Secondary | ICD-10-CM | POA: Insufficient documentation

## 2014-04-08 DIAGNOSIS — K644 Residual hemorrhoidal skin tags: Secondary | ICD-10-CM | POA: Insufficient documentation

## 2014-04-08 DIAGNOSIS — R7401 Elevation of levels of liver transaminase levels: Secondary | ICD-10-CM

## 2014-04-08 DIAGNOSIS — F3289 Other specified depressive episodes: Secondary | ICD-10-CM | POA: Insufficient documentation

## 2014-04-08 DIAGNOSIS — K6389 Other specified diseases of intestine: Secondary | ICD-10-CM

## 2014-04-08 DIAGNOSIS — E0500 Thyrotoxicosis with diffuse goiter without thyrotoxic crisis or storm: Secondary | ICD-10-CM | POA: Insufficient documentation

## 2014-04-08 DIAGNOSIS — R112 Nausea with vomiting, unspecified: Secondary | ICD-10-CM

## 2014-04-08 DIAGNOSIS — K2990 Gastroduodenitis, unspecified, without bleeding: Secondary | ICD-10-CM

## 2014-04-08 DIAGNOSIS — R740 Nonspecific elevation of levels of transaminase and lactic acid dehydrogenase [LDH]: Secondary | ICD-10-CM

## 2014-04-08 DIAGNOSIS — K2970 Gastritis, unspecified, without bleeding: Secondary | ICD-10-CM

## 2014-04-08 DIAGNOSIS — K921 Melena: Secondary | ICD-10-CM | POA: Insufficient documentation

## 2014-04-08 DIAGNOSIS — K208 Other esophagitis without bleeding: Secondary | ICD-10-CM

## 2014-04-08 DIAGNOSIS — K5730 Diverticulosis of large intestine without perforation or abscess without bleeding: Secondary | ICD-10-CM | POA: Insufficient documentation

## 2014-04-08 DIAGNOSIS — R197 Diarrhea, unspecified: Secondary | ICD-10-CM | POA: Insufficient documentation

## 2014-04-08 DIAGNOSIS — R109 Unspecified abdominal pain: Secondary | ICD-10-CM | POA: Insufficient documentation

## 2014-04-08 DIAGNOSIS — F411 Generalized anxiety disorder: Secondary | ICD-10-CM | POA: Insufficient documentation

## 2014-04-08 HISTORY — PX: ESOPHAGOGASTRODUODENOSCOPY: SHX5428

## 2014-04-08 HISTORY — PX: COLONOSCOPY: SHX5424

## 2014-04-08 LAB — HEPATIC FUNCTION PANEL
ALK PHOS: 76 U/L (ref 39–117)
ALT: 36 U/L (ref 0–53)
AST: 30 U/L (ref 0–37)
Albumin: 4.1 g/dL (ref 3.5–5.2)
Bilirubin, Direct: <0.2 mg/dL (ref 0.0–0.3)
Total Bilirubin: 0.3 mg/dL (ref 0.3–1.2)
Total Protein: 7 g/dL (ref 6.0–8.3)

## 2014-04-08 SURGERY — COLONOSCOPY
Anesthesia: Moderate Sedation

## 2014-04-08 MED ORDER — DICYCLOMINE HCL 10 MG PO CAPS: 10.0000 mg | ORAL_CAPSULE | Freq: Three times a day (TID) | ORAL | Status: DC

## 2014-04-08 MED ORDER — MIDAZOLAM HCL 5 MG/5ML IJ SOLN
INTRAMUSCULAR | Status: AC
  Filled 2014-04-08: qty 10

## 2014-04-08 MED ORDER — MEPERIDINE HCL 50 MG/ML IJ SOLN
INTRAMUSCULAR | Status: AC
  Filled 2014-04-08: qty 1

## 2014-04-08 MED ORDER — PANTOPRAZOLE SODIUM 40 MG PO TBEC: 40.0000 mg | DELAYED_RELEASE_TABLET | Freq: Two times a day (BID) | ORAL | Status: DC

## 2014-04-08 MED ORDER — MEPERIDINE HCL 50 MG/ML IJ SOLN
INTRAMUSCULAR | Status: DC | PRN
  Administered 2014-04-08 (×2): 25 mg via INTRAVENOUS

## 2014-04-08 MED ORDER — STERILE WATER FOR IRRIGATION IR SOLN
Status: DC | PRN
  Administered 2014-04-08: 14:00:00

## 2014-04-08 MED ORDER — BUTAMBEN-TETRACAINE-BENZOCAINE 2-2-14 % EX AERO
INHALATION_SPRAY | CUTANEOUS | Status: DC | PRN
  Administered 2014-04-08: 2 via TOPICAL

## 2014-04-08 MED ORDER — SODIUM CHLORIDE 0.9 % IV SOLN
INTRAVENOUS | Status: DC
  Administered 2014-04-08: 13:00:00 via INTRAVENOUS

## 2014-04-08 MED ORDER — MIDAZOLAM HCL 5 MG/5ML IJ SOLN
INTRAMUSCULAR | Status: DC | PRN
  Administered 2014-04-08 (×2): 2 mg via INTRAVENOUS
  Administered 2014-04-08: 3 mg via INTRAVENOUS
  Administered 2014-04-08: 2 mg via INTRAVENOUS
  Administered 2014-04-08: 3 mg via INTRAVENOUS

## 2014-04-08 NOTE — Discharge Instructions (Signed)
Resume usual medications but discontinue Nexium. Resume usual diet. Pantoprazole 40 mg by mouth 30 mg before breakfast daily. Dicyclomine 10 mg by mouth 30 minutes before each meal daily. Remember to keep alcohol intake to no more than 2 drinks per day or stop it altogether. Physician will call with results of blood tests. Office visit in 4 weeks.   Esophagogastroduodenoscopy Care After Refer to this sheet in the next few weeks. These instructions provide you with information on caring for yourself after your procedure. Your caregiver may also give you more specific instructions. Your treatment has been planned according to current medical practices, but problems sometimes occur. Call your caregiver if you have any problems or questions after your procedure.  HOME CARE INSTRUCTIONS  Do not eat or drink anything until the numbing medicine (local anesthetic) has worn off and your gag reflex has returned. You will know that the local anesthetic has worn off when you can swallow comfortably.  Do not drive for 12 hours after the procedure or as directed by your caregiver.  Only take medicines as directed by your caregiver. SEEK MEDICAL CARE IF:   You cannot stop coughing.  You are not urinating at all or less than usual. SEEK IMMEDIATE MEDICAL CARE IF:  You have difficulty swallowing.  You cannot eat or drink.  You have worsening throat or chest pain.  You have dizziness, lightheadedness, or you faint.  You have nausea or vomiting.  You have chills.  You have a fever.  You have severe abdominal pain.  You have black, tarry, or bloody stools. Document Released: 10/30/2012 Document Reviewed: 10/30/2012 [facility] Patient Information 2014 [place], [place].   Colonoscopy, Care After Refer to this sheet in the next few weeks. These instructions provide you with information on caring for yourself after your procedure. Your health care provider may also give you more specific  instructions. Your treatment has been planned according to current medical practices, but problems sometimes occur. Call your health care provider if you have any problems or questions after your procedure. WHAT TO EXPECT AFTER THE PROCEDURE  After your procedure, it is typical to have the following:  A small amount of blood in your stool.  Moderate amounts of gas and mild abdominal cramping or bloating. HOME CARE INSTRUCTIONS  Do not drive, operate machinery, or sign important documents for 24 hours.  You may shower and resume your regular physical activities, but move at a slower pace for the first 24 hours.  Take frequent rest periods for the first 24 hours.  Walk around or put a warm pack on your abdomen to help reduce abdominal cramping and bloating.  Drink enough fluids to keep your urine clear or pale yellow.  You may resume your normal diet as instructed by your health care provider. Avoid heavy or fried foods that are hard to digest.  Avoid drinking alcohol for 24 hours or as instructed by your health care provider.  Only take over-the-counter or prescription medicines as directed by your health care provider.  If a tissue sample (biopsy) was taken during your procedure:  Do not take aspirin or blood thinners for 7 days, or as instructed by your health care provider.  Do not drink alcohol for 7 days, or as instructed by your health care provider.  Eat soft foods for the first 24 hours. SEEK MEDICAL CARE IF: You have persistent spotting of blood in your stool 2 3 days after the procedure. SEEK IMMEDIATE MEDICAL CARE IF:  You have  more than a small spotting of blood in your stool.  You pass large blood clots in your stool.  Your abdomen is swollen (distended).  You have nausea or vomiting.  You have a fever.  You have increasing abdominal pain that is not relieved with medicine. Document Released: 06/27/2004 Document Revised: 09/03/2013 Document Reviewed:  07/21/2013 [facility] Patient Information 2014 [place], [place].

## 2014-04-08 NOTE — H&P (Addendum)
Jason Lamb is an 53 y.o. male.   Chief Complaint: Patient is here for EGD and colonoscopy. HPI: Patient is 53 year old Caucasian male who presents with multiple symptoms which include intermittent nausea and vomiting diarrhea melena single episode of small volume hematochezia and 20 pound weight loss. Symptoms started a few months ago. He has not gotten any relief with PPI. He was in onset which has been discontinued but without symptomatic relief. He has been using Pepto-Bismol on an as-needed basis but he states his black stool started long before he took Pepto-Bismol. He is having at least 3 loose stools daily. He also has nocturnal diarrhea nausea and vomiting. He drinks anywhere from 4-12 cans of beer daily. Blood work about 2 weeks ago revealed an mildly elevated serum is and AST and mild leukocytosis. Abdominopelvic CT one month ago was unremarkable.   Past Medical History  Diagnosis Date  . Bronchitis   . GERD (gastroesophageal reflux disease)   . Graves disease   . Depression   . Anxiety     Past Surgical History  Procedure Laterality Date  . Appendectomy      History reviewed. No pertinent family history. Social History:  reports that he has been smoking Cigarettes.  He has a 35 pack-year smoking history. He has never used smokeless tobacco. He reports that he drinks about 7.2 ounces of alcohol per week. He reports that he uses illicit drugs (Marijuana).  Allergies:  Allergies  Allergen Reactions  . Penicillins Other (See Comments)    Childhood allergy    Medications Prior to Admission  Medication Sig Dispense Refill  . ALPRAZolam (XANAX) 1 MG tablet Take 1-2 mg by mouth at bedtime.       Marland Kitchen. esomeprazole (NEXIUM) 20 MG capsule Take 20 mg by mouth daily.      Marland Kitchen. HYDROcodone-acetaminophen (NORCO/VICODIN) 5-325 MG per tablet Take 1 tablet by mouth 3 (three) times daily as needed for moderate pain.       Marland Kitchen. ondansetron (ZOFRAN) 8 MG tablet Take 1 tablet (8 mg total) by mouth  every 8 (eight) hours as needed.  12 tablet  0    No results found for this or any previous visit (from the past 48 hour(s)). No results found.  ROS  Blood pressure 109/76, pulse 89, temperature 98.7 F (37.1 C), temperature source Oral, weight 118 lb (53.524 kg), SpO2 99.00%. Physical Exam  Constitutional:  Well-developed thin Caucasian male in NAD  HENT:  Mouth/Throat: Oropharynx is clear and moist.  Eyes: Conjunctivae are normal. No scleral icterus.  Neck: No thyromegaly present.  Cardiovascular: Normal rate, regular rhythm and normal heart sounds.   No murmur heard. Respiratory: Effort normal and breath sounds normal.  GI:  Abdomen is flat and soft with mild tenderness in midepigastric region, at RUQ and RLQ. No organomegaly or masses.  Musculoskeletal: He exhibits no edema.  Lymphadenopathy:    He has no cervical adenopathy.  Skin: Skin is warm.  Tattoo over the precordial region     Assessment/Plan Nausea vomiting and history of melena. Left-sided abdominal pain diarrhea and single episode of hematochezia. Diagnostic EGD and colonoscopy.  Jason Lamb U Jason Lamb 04/08/2014, 1:53 PM

## 2014-04-08 NOTE — Op Note (Signed)
EGD PROCEDURE REPORT  PATIENT:  Jason Lamb  MR#:  [id number] Birthdate:  07/16/1961, 53 y.o., male Endoscopist:  Dr. Malissa HippoNajeeb U. Rehman, MD Referred By:  Dr. Josue HectorLeonard Robert Nyland, MD Procedure Date: 04/08/2014  Procedure:   EGD & Colonoscopy  Indications:  Patient is 53 year old Caucasian male who presents with nausea vomiting and left-sided abdominal pain, melena, diarrhea and single episode of hematochezia. He has lost 20 pounds within the last few months. He has not gotten relief with OTC PPI. Lab studies pertinent for mildly elevated AST and serum lipase abdominopelvic CT one month ago was unremarkable. Patient is to take NSAIDs but not anymore. He drinks anywhere from 4-12 cans of beer daily.            Informed Consent:  The risks, benefits, alternatives & imponderables which include, but are not limited to, bleeding, infection, perforation, drug reaction and potential missed lesion have been reviewed.  The potential for biopsy, lesion removal, esophageal dilation, etc. have also been discussed.  Questions have been answered.  All parties agreeable.  Please see history & physical in medical record for more information.  Medications:  Demerol 50 mg IV Versed 12 mg IV Cetacaine spray topically for oropharyngeal anesthesia  EGD  Description of procedure:  The endoscope was introduced through the mouth and advanced to the second portion of the duodenum without difficulty or limitations. The mucosal surfaces were surveyed very carefully during advancement of the scope and upon withdrawal.  Findings:  Esophagus:  Mucosa of the esophagus was normal with exception of single erosion at distal esophagus and focal erythema and edema at GE junction. GEJ:  39 cm Stomach:  Stomach was empty and distended very well with insufflation. Folds in the proximal stomach were normal. Patchy edema and erythema noted to mucosa at gastric body and antrum without erosions or ulcers. Pyloric channel was patent.  Angularis fundus and cardia were unremarkable. Duodenum:  Patchy edema and erythema noted to bulbar mucosa. Post bulbar mucosa was normal.  Therapeutic/Diagnostic Maneuvers Performed:  None  COLONOSCOPY Description of procedure:  After a digital rectal exam was performed, that colonoscope was advanced from the anus through the rectum and colon to the area of the cecum, ileocecal valve and appendiceal orifice. The cecum was deeply intubated. These structures were well-seen and photographed for the record. From the level of the cecum and ileocecal valve, the scope was slowly and cautiously withdrawn. The mucosal surfaces were carefully surveyed utilizing scope tip to flexion to facilitate fold flattening as needed. The scope was pulled down into the rectum where a thorough exam including retroflexion was performed. Terminal ileum was also examined.  Findings:   Prep excellent. Normal mucosa of terminal ileum. Scattered diverticula throughout the colon but no endoscopic evidence of colitis. Normal rectal mucosa. Small hemorrhoids below the dentate line along with few anal papillae.  Therapeutic/Diagnostic Maneuvers Performed:  None  Complications:  None  Cecal Withdrawal Time:  11 minutes  Impression:   EGD findings; Erosive reflux esophagitis. Nonerosive gastroduodenitis.  Colonoscopy findings; Normal mucosa of terminal ileum. Scattered diverticula throughout the colon. Small external hemorrhoids and anal papillae.  Comment; Some of patient's symptoms are suggestive of IBS. Rest of the symptoms last be secondary to excessive alcohol intake.  Recommendations:  Will check H. pylori serology, LFTs, hepatitis B surface antigen and hepatitis C virus antibody. Discontinue Nexium. Dicyclomine 10 mg by mouth 3 times a day. Pantoprazole 40 mg by mouth every morning. Patient advised to keep alcohol intake  to no more than two drinks per day. I will be contacting dictation results of lab  tests. Office visit in one month.  Malissa Hippoajeeb U Rehman  04/08/2014 2:46 PM  CC: Dr. Josue HectorNYLAND,LEONARD ROBERT, MD & Dr. Bonnetta BarryNo ref. provider found

## 2014-04-09 ENCOUNTER — Encounter (HOSPITAL_COMMUNITY): Payer: Self-pay | Admitting: Internal Medicine

## 2014-04-09 LAB — H. PYLORI ANTIBODY, IGG: H Pylori IgG: 2.81 {ISR} — ABNORMAL HIGH

## 2014-04-09 LAB — HEPATITIS C ANTIBODY: HCV Ab: NEGATIVE

## 2014-04-09 LAB — HEPATITIS B SURFACE ANTIGEN: HEP B S AG: NEGATIVE

## 2014-05-06 ENCOUNTER — Ambulatory Visit (INDEPENDENT_AMBULATORY_CARE_PROVIDER_SITE_OTHER): Payer: Self-pay | Admitting: Internal Medicine

## 2014-05-06 ENCOUNTER — Telehealth (INDEPENDENT_AMBULATORY_CARE_PROVIDER_SITE_OTHER): Payer: Self-pay | Admitting: *Deleted

## 2014-05-06 NOTE — Telephone Encounter (Signed)
Derico No Showed for his apt on 05/06/14 with Dorene Ar, NP.

## 2014-05-08 ENCOUNTER — Encounter (INDEPENDENT_AMBULATORY_CARE_PROVIDER_SITE_OTHER): Payer: Self-pay | Admitting: *Deleted

## 2014-07-05 ENCOUNTER — Other Ambulatory Visit (INDEPENDENT_AMBULATORY_CARE_PROVIDER_SITE_OTHER): Payer: Self-pay | Admitting: Internal Medicine

## 2014-07-06 NOTE — Telephone Encounter (Signed)
Per Dr.Rehman may fill with 5 refills 

## 2014-07-15 ENCOUNTER — Ambulatory Visit (INDEPENDENT_AMBULATORY_CARE_PROVIDER_SITE_OTHER): Payer: Self-pay | Admitting: Internal Medicine

## 2014-07-15 ENCOUNTER — Encounter (INDEPENDENT_AMBULATORY_CARE_PROVIDER_SITE_OTHER): Payer: Self-pay | Admitting: Internal Medicine

## 2014-07-15 ENCOUNTER — Encounter (INDEPENDENT_AMBULATORY_CARE_PROVIDER_SITE_OTHER): Payer: Self-pay | Admitting: *Deleted

## 2014-07-15 VITALS — BP 90/52 | HR 80 | Temp 98.0°F | Ht 68.0 in | Wt 121.9 lb

## 2014-07-15 DIAGNOSIS — A048 Other specified bacterial intestinal infections: Secondary | ICD-10-CM

## 2014-07-15 NOTE — Progress Notes (Signed)
Subjective:     Patient ID: Jason Lamb, male   DOB: 02/25/1961, 53 y.o.   MRN: [id number]  HPI Here today for f/u. Underwent an EGD/Colonoscopy. He tells me he is doing okay. The Protonix is helping.  He does have some acid reflux. Appetite is okay.  He tells me he is drinking about 4-5 beers a day. He was drinking 12 beers a day in May. He has lost 2 pounds since his last office visit in April.  No melena or BRRB.   04/08/2014 EGD/Colonoscopy:  EGD findings;  Erosive reflux esophagitis.  Nonerosive gastroduodenitis.  Colonoscopy findings;  Normal mucosa of terminal ileum.  Scattered diverticula throughout the colon.  Small external hemorrhoids and anal papillae.  H. Pylori positive.     Review of Systems      Past Medical History  Diagnosis Date  . Bronchitis   . GERD (gastroesophageal reflux disease)   . Graves disease   . Depression   . Anxiety     Past Surgical History  Procedure Laterality Date  . Appendectomy    . Colonoscopy N/A 04/08/2014    Procedure: COLONOSCOPY;  Surgeon: Malissa HippoNajeeb U Rehman, MD;  Location: [facility];  Service: Endoscopy;  Laterality: N/A;  135  . Esophagogastroduodenoscopy N/A 04/08/2014    Procedure: ESOPHAGOGASTRODUODENOSCOPY (EGD);  Surgeon: Malissa HippoNajeeb U Rehman, MD;  Location: [facility];  Service: Endoscopy;  Laterality: N/A;    Allergies  Allergen Reactions  . Penicillins Other (See Comments)    Childhood allergy    Current Outpatient Prescriptions on File Prior to Visit  Medication Sig Dispense Refill  . ALPRAZolam (XANAX) 1 MG tablet Take 1-2 mg by mouth at bedtime.       . dicyclomine (BENTYL) 10 MG capsule TAKE 1 CAPSULE (10 MG TOTAL)  BY MOUTH 3 (THREE) TIMES DAILY BEFORE MEA LS.  90 capsule  5  . HYDROcodone-acetaminophen (NORCO/VICODIN) 5-325 MG per tablet Take 1 tablet by mouth 3 (three) times daily as needed for moderate pain.       . pantoprazole (PROTONIX) 40 MG tablet Take 1 tablet (40 mg total) by mouth 2 (two) times  daily before a meal.  180 tablet  3   No current facility-administered medications on file prior to visit.     Objective:   Physical Exam  Filed Vitals:   07/15/14 1052  BP: 90/52  Pulse: 80  Temp: 98 F (36.7 C)  Height: 5\' 8"  (1.727 m)  Weight: 121 lb 14.4 oz (55.293 kg)  Alert and oriented. Skin warm and dry. Oral mucosa is moist.   . Sclera anicteric, conjunctivae is pink. Thyroid not enlarged. No cervical lymphadenopathy. Lungs clear. Heart regular rate and rhythm.  Abdomen is soft. Bowel sounds are positive. No hepatomegaly. No abdominal masses felt. No tenderness.  No edema to lower extremities.        Assessment:     GERD. Taking Protonix which helps.  H. Pylori +. Has not been treated.     Plan:     OV in one year. Pylera samples (complete tx) given to patient and reviewed with him.

## 2014-07-15 NOTE — Patient Instructions (Signed)
Pylera. Directions reviewed with patient. Her verbalizes understanding.

## 2014-08-05 ENCOUNTER — Emergency Department (HOSPITAL_COMMUNITY): Payer: Self-pay

## 2014-08-05 ENCOUNTER — Emergency Department (HOSPITAL_COMMUNITY)
Admission: EM | Admit: 2014-08-05 | Discharge: 2014-08-05 | Disposition: A | Discharge status: Home / Self Care | Payer: Self-pay | Attending: Emergency Medicine | Admitting: Emergency Medicine

## 2014-08-05 ENCOUNTER — Encounter (HOSPITAL_COMMUNITY): Payer: Self-pay | Admitting: Emergency Medicine

## 2014-08-05 DIAGNOSIS — F1010 Alcohol abuse, uncomplicated: Secondary | ICD-10-CM | POA: Insufficient documentation

## 2014-08-05 DIAGNOSIS — F3289 Other specified depressive episodes: Secondary | ICD-10-CM | POA: Insufficient documentation

## 2014-08-05 DIAGNOSIS — E860 Dehydration: Secondary | ICD-10-CM | POA: Insufficient documentation

## 2014-08-05 DIAGNOSIS — R1084 Generalized abdominal pain: Secondary | ICD-10-CM | POA: Insufficient documentation

## 2014-08-05 DIAGNOSIS — Z79899 Other long term (current) drug therapy: Secondary | ICD-10-CM | POA: Insufficient documentation

## 2014-08-05 DIAGNOSIS — R748 Abnormal levels of other serum enzymes: Secondary | ICD-10-CM | POA: Insufficient documentation

## 2014-08-05 DIAGNOSIS — R1110 Vomiting, unspecified: Secondary | ICD-10-CM | POA: Insufficient documentation

## 2014-08-05 DIAGNOSIS — Z791 Long term (current) use of non-steroidal anti-inflammatories (NSAID): Secondary | ICD-10-CM | POA: Insufficient documentation

## 2014-08-05 DIAGNOSIS — Z9089 Acquired absence of other organs: Secondary | ICD-10-CM | POA: Insufficient documentation

## 2014-08-05 DIAGNOSIS — Z9889 Other specified postprocedural states: Secondary | ICD-10-CM | POA: Insufficient documentation

## 2014-08-05 DIAGNOSIS — F411 Generalized anxiety disorder: Secondary | ICD-10-CM | POA: Insufficient documentation

## 2014-08-05 DIAGNOSIS — Z880 Allergy status to penicillin: Secondary | ICD-10-CM | POA: Insufficient documentation

## 2014-08-05 DIAGNOSIS — F329 Major depressive disorder, single episode, unspecified: Secondary | ICD-10-CM | POA: Insufficient documentation

## 2014-08-05 DIAGNOSIS — Z8709 Personal history of other diseases of the respiratory system: Secondary | ICD-10-CM | POA: Insufficient documentation

## 2014-08-05 DIAGNOSIS — F17200 Nicotine dependence, unspecified, uncomplicated: Secondary | ICD-10-CM | POA: Insufficient documentation

## 2014-08-05 DIAGNOSIS — K219 Gastro-esophageal reflux disease without esophagitis: Secondary | ICD-10-CM | POA: Insufficient documentation

## 2014-08-05 LAB — BASIC METABOLIC PANEL
Anion gap: 16 — ABNORMAL HIGH (ref 5–15)
BUN: 4 mg/dL — ABNORMAL LOW (ref 6–23)
CO2: 19 meq/L (ref 19–32)
Calcium: 7.5 mg/dL — ABNORMAL LOW (ref 8.4–10.5)
Chloride: 101 mEq/L (ref 96–112)
Creatinine, Ser: 0.65 mg/dL (ref 0.50–1.35)
GFR calc Af Amer: >90 mL/min (ref >90)
GFR calc non Af Amer: >90 mL/min (ref >90)
GLUCOSE: 274 mg/dL — AB (ref 70–99)
Potassium: 3.9 mEq/L (ref 3.7–5.3)
SODIUM: 136 meq/L — AB (ref 137–147)

## 2014-08-05 LAB — COMPREHENSIVE METABOLIC PANEL
ALBUMIN: 4.6 g/dL (ref 3.5–5.2)
ALT: 143 U/L — ABNORMAL HIGH (ref 0–53)
ANION GAP: 25 — AB (ref 5–15)
AST: 132 U/L — AB (ref 0–37)
Alkaline Phosphatase: 63 U/L (ref 39–117)
BUN: 4 mg/dL — AB (ref 6–23)
CALCIUM: 9.3 mg/dL (ref 8.4–10.5)
CHLORIDE: 95 meq/L — AB (ref 96–112)
CO2: 17 mEq/L — ABNORMAL LOW (ref 19–32)
CREATININE: 0.66 mg/dL (ref 0.50–1.35)
GFR calc Af Amer: >90 mL/min (ref >90)
GFR calc non Af Amer: >90 mL/min (ref >90)
Glucose, Bld: 66 mg/dL — ABNORMAL LOW (ref 70–99)
Potassium: 4.3 mEq/L (ref 3.7–5.3)
Sodium: 137 mEq/L (ref 137–147)
Total Bilirubin: 0.6 mg/dL (ref 0.3–1.2)
Total Protein: 7.6 g/dL (ref 6.0–8.3)

## 2014-08-05 LAB — CBC WITH DIFFERENTIAL/PLATELET
BASOS ABS: 0.1 10*3/uL (ref 0.0–0.1)
BASOS PCT: 1 % (ref 0–1)
EOS ABS: 0 10*3/uL (ref 0.0–0.7)
EOS PCT: 0 % (ref 0–5)
HEMATOCRIT: 42.9 % (ref 39.0–52.0)
HEMOGLOBIN: 15.1 g/dL (ref 13.0–17.0)
Lymphocytes Relative: 22 % (ref 12–46)
Lymphs Abs: 2.2 10*3/uL (ref 0.7–4.0)
MCH: 31 pg (ref 26.0–34.0)
MCHC: 35.2 g/dL (ref 30.0–36.0)
MCV: 88.1 fL (ref 78.0–100.0)
MONOS PCT: 7 % (ref 3–12)
Monocytes Absolute: 0.7 10*3/uL (ref 0.1–1.0)
NEUTROS ABS: 6.7 10*3/uL (ref 1.7–7.7)
Neutrophils Relative %: 70 % (ref 43–77)
Platelets: 323 10*3/uL (ref 150–400)
RBC: 4.87 MIL/uL (ref 4.22–5.81)
RDW: 14 % (ref 11.5–15.5)
WBC: 9.6 10*3/uL (ref 4.0–10.5)

## 2014-08-05 LAB — URINALYSIS, ROUTINE W REFLEX MICROSCOPIC
Bilirubin Urine: NEGATIVE
Glucose, UA: NEGATIVE mg/dL
Hgb urine dipstick: NEGATIVE
Ketones, ur: 40 mg/dL — AB
LEUKOCYTES UA: NEGATIVE
Nitrite: NEGATIVE
PH: 6 (ref 5.0–8.0)
PROTEIN: NEGATIVE mg/dL
Specific Gravity, Urine: <1.005 — ABNORMAL LOW (ref 1.005–1.030)
Urobilinogen, UA: 0.2 mg/dL (ref 0.0–1.0)

## 2014-08-05 LAB — I-STAT CG4 LACTIC ACID, ED: Lactic Acid, Venous: 2.89 mmol/L — ABNORMAL HIGH (ref 0.5–2.2)

## 2014-08-05 LAB — TROPONIN I

## 2014-08-05 LAB — LIPASE, BLOOD: Lipase: 46 U/L (ref 11–59)

## 2014-08-05 LAB — SALICYLATE LEVEL: Salicylate Lvl: <2 mg/dL — ABNORMAL LOW (ref 2.8–20.0)

## 2014-08-05 LAB — ETHANOL: ALCOHOL ETHYL (B): 75 mg/dL — AB (ref 0–11)

## 2014-08-05 LAB — LACTIC ACID, PLASMA: Lactic Acid, Venous: 1.1 mmol/L (ref 0.5–2.2)

## 2014-08-05 MED ORDER — IOHEXOL 300 MG/ML  SOLN
100.0000 mL | Freq: Once | INTRAMUSCULAR | Status: AC | PRN
  Administered 2014-08-05: 100 mL via INTRAVENOUS

## 2014-08-05 MED ORDER — DEXTROSE 50 % IV SOLN
INTRAVENOUS | Status: AC
  Filled 2014-08-05: qty 50

## 2014-08-05 MED ORDER — ONDANSETRON HCL 4 MG PO TABS: 4.0000 mg | ORAL_TABLET | Freq: Three times a day (TID) | ORAL | Status: DC | PRN

## 2014-08-05 MED ORDER — DEXTROSE 50 % IV SOLN
25.0000 mL | Freq: Once | INTRAVENOUS | Status: AC
Start: 2014-08-05 — End: 2014-08-05
  Administered 2014-08-05: 25 mL via INTRAVENOUS

## 2014-08-05 MED ORDER — LORAZEPAM 2 MG/ML IJ SOLN
1.0000 mg | Freq: Once | INTRAMUSCULAR | Status: AC
  Administered 2014-08-05: 1 mg via INTRAVENOUS
  Filled 2014-08-05: qty 1

## 2014-08-05 MED ORDER — ONDANSETRON HCL 4 MG/2ML IJ SOLN
4.0000 mg | Freq: Once | INTRAMUSCULAR | Status: AC
  Administered 2014-08-05: 4 mg via INTRAVENOUS
  Filled 2014-08-05: qty 2

## 2014-08-05 MED ORDER — SODIUM CHLORIDE 0.9 % IV BOLUS (SEPSIS)
1000.0000 mL | Freq: Once | INTRAVENOUS | Status: AC
  Administered 2014-08-05: 1000 mL via INTRAVENOUS

## 2014-08-05 MED ORDER — DIPHENOXYLATE-ATROPINE 2.5-0.025 MG PO TABS: 2.0000 | ORAL_TABLET | Freq: Four times a day (QID) | ORAL | Status: DC | PRN

## 2014-08-05 MED ORDER — HYDROMORPHONE HCL PF 1 MG/ML IJ SOLN
0.5000 mg | Freq: Once | INTRAMUSCULAR | Status: AC
  Administered 2014-08-05: 0.5 mg via INTRAVENOUS
  Filled 2014-08-05: qty 1

## 2014-08-05 MED ORDER — DEXTROSE-NACL 5-0.9 % IV SOLN
INTRAVENOUS | Status: DC
  Administered 2014-08-05: 19:00:00 via INTRAVENOUS

## 2014-08-05 NOTE — ED Provider Notes (Signed)
CSN: [id number]     Arrival date & time 08/05/14  1127 History   First MD Initiated Contact with Patient 08/05/14 1157     Chief Complaint  Patient presents with  . Emesis  . Loss of Consciousness     (Consider location/radiation/quality/duration/timing/severity/associated sxs/prior Treatment) HPI Comments: 53 y/o male with hx of chronic n/v/d - has nad multiple ED visits for same and has f/u with GI - neg CT abd/ pelvis and colonoscopy / upper endoscopy from several months ago showed mild esophagitis but no PUD, no colotis and no answers.  H was treated for H Pylori but sx have continued.  Diarrhea is very loose, multiple times a day and is triggered by eating.  He has no blood in it.  He drinks > 12 beers a day and at noon today he had already had several beers.  He states it is the only thing that tastes good.  He has been able to eat some food but b/c of the instant diarrhea he has had less solid PO lately.  No fevers, no recent abx, no rash, no cough, no cp, pain in the abd is the LUQ and LLQ.  He also c/o dysuria.  He does report getting light headed and dizzy mostly with standing and denies passing out to this examiner.   Patient is a 53 y.o. male presenting with vomiting and syncope. The history is provided by the patient and medical records.  Emesis Loss of Consciousness Associated symptoms: vomiting     Past Medical History  Diagnosis Date  . Bronchitis   . GERD (gastroesophageal reflux disease)   . Graves disease   . Depression   . Anxiety    Past Surgical History  Procedure Laterality Date  . Appendectomy    . Colonoscopy N/A 04/08/2014    Procedure: COLONOSCOPY;  Surgeon: Malissa Hippo, MD;  [facility];  Service: Endoscopy;  Laterality: N/A;  135  . Esophagogastroduodenoscopy N/A 04/08/2014    Procedure: ESOPHAGOGASTRODUODENOSCOPY (EGD);  Surgeon: Malissa Hippo, MD;  [facility];  Service: Endoscopy;  Laterality: N/A;   No family history on  file. History  Substance Use Topics  . Smoking status: Current Every Day Smoker -- 1.00 packs/day for 35 years    Types: Cigarettes  . Smokeless tobacco: Never Used     Comment: 1 pack a day  . Alcohol Use: 7.2 oz/week    12 Cans of beer per week     Comment: daily x 12 beers a day usually.    Review of Systems  Cardiovascular: Positive for syncope.  Gastrointestinal: Positive for vomiting.  All other systems reviewed and are negative.     Allergies  Penicillins  Home Medications   Prior to Admission medications   Medication Sig Start Date End Date Taking? Authorizing Provider  ALPRAZolam Prudy Feeler) 1 MG tablet Take 1-2 mg by mouth at bedtime.    Yes Historical Provider, MD  dicyclomine (BENTYL) 10 MG capsule TAKE 1 CAPSULE (10 MG TOTAL)  BY MOUTH 3 (THREE) TIMES DAILY BEFORE MEA LS.   Yes Malissa Hippo, MD  HYDROcodone-acetaminophen (NORCO/VICODIN) 5-325 MG per tablet Take 1 tablet by mouth 3 (three) times daily as needed for moderate pain.    Yes Historical Provider, MD  meloxicam (MOBIC) 7.5 MG tablet Take 7.5 mg by mouth 2 (two) times daily before a meal.   Yes Historical Provider, MD  pantoprazole (PROTONIX) 40 MG tablet Take 1 tablet (40 mg total)  by mouth 2 (two) times daily before a meal. 04/08/14  Yes Malissa Hippo, MD  diphenoxylate-atropine (LOMOTIL) 2.5-0.025 MG per tablet Take 2 tablets by mouth 4 (four) times daily as needed for diarrhea or loose stools. 08/05/14   Vida Roller, MD  ondansetron (ZOFRAN) 4 MG tablet Take 1 tablet (4 mg total) by mouth every 8 (eight) hours as needed for nausea or vomiting. 08/05/14   Vida Roller, MD   BP 125/82  Pulse 89  Temp(Src) 98.6 F (37 C) (Oral)  Resp 16  Ht  (1.727 m)  Wt 120 lb (54.432 kg)  BMI 18.25 kg/m2  SpO2 95% Physical Exam  Nursing note and vitals reviewed. Constitutional: He appears well-developed and well-nourished. No distress.  HENT:  Head: Normocephalic and atraumatic.  Mouth/Throat: Oropharynx  is clear and moist. No oropharyngeal exudate.  MM mildly dehdyrated  Eyes: Conjunctivae and EOM are normal. Pupils are equal, round, and reactive to light. Right eye exhibits no discharge. Left eye exhibits no discharge. No scleral icterus.  Neck: Normal range of motion. Neck supple. No JVD present. No thyromegaly present.  Cardiovascular: Normal rate, regular rhythm, normal heart sounds and intact distal pulses.  Exam reveals no gallop and no friction rub.   No murmur heard. Pulmonary/Chest: Effort normal and breath sounds normal. No respiratory distress. He has no wheezes. He has no rales.  Abdominal: Soft. Bowel sounds are normal. He exhibits no distension and no mass. There is tenderness ( mild LUQ and LLQ ttp without guarding, no peritoneal signs and no masses.).  Musculoskeletal: Normal range of motion. He exhibits no edema and no tenderness.  Lymphadenopathy:    He has no cervical adenopathy.  Neurological: He is alert. Coordination normal.  Skin: Skin is warm and dry. No rash noted. No erythema.  Psychiatric: He has a normal mood and affect. His behavior is normal.    ED Course  Procedures (including critical care time) Labs Review Labs Reviewed  COMPREHENSIVE METABOLIC PANEL - Abnormal; Notable for the following:    Chloride 95 (*)    CO2 17 (*)    Glucose, Bld 66 (*)    BUN 4 (*)    AST 132 (*)    ALT 143 (*)    Anion gap 25 (*)    All other components within normal limits  URINALYSIS, ROUTINE W REFLEX MICROSCOPIC - Abnormal; Notable for the following:    Specific Gravity, Urine <1.005 (*)    Ketones, ur 40 (*)    All other components within normal limits  ETHANOL - Abnormal; Notable for the following:    Alcohol, Ethyl (B) 75 (*)    All other components within normal limits  SALICYLATE LEVEL - Abnormal; Notable for the following:    Salicylate Lvl <2.0 (*)    All other components within normal limits  BASIC METABOLIC PANEL - Abnormal; Notable for the following:     Sodium 136 (*)    Glucose, Bld 274 (*)    BUN 4 (*)    Calcium 7.5 (*)    Anion gap 16 (*)    All other components within normal limits  I-STAT CG4 LACTIC ACID, ED - Abnormal; Notable for the following:    Lactic Acid, Venous 2.89 (*)    All other components within normal limits  CBC WITH DIFFERENTIAL  TROPONIN I  LIPASE, BLOOD  LACTIC ACID, PLASMA    Imaging Review Ct Abdomen Pelvis W Contrast  08/05/2014   CLINICAL DATA:  Abdominal pain. Acidotic. Emesis with loss of consciousness  EXAM: CT ABDOMEN AND PELVIS WITH CONTRAST  TECHNIQUE: Multidetector CT imaging of the abdomen and pelvis was performed using the standard protocol following bolus administration of intravenous contrast.  CONTRAST:  OMNIPAQUE IOHEXOL 300 MG/ML  SOLN  COMPARISON:  02/04/2014  FINDINGS: The lung bases are clear.  No pleural or pericardial effusion.  Mild low attenuation within the liver parenchyma is noted suggestive of hepatic steatosis. The gallbladder appears normal. There is no biliary dilatation. The pancreas and spleen are unremarkable.  The adrenal glands both appear normal. The right kidney appears normal. There is a 9 mm cyst within the left kidney. Unchanged from previous exam. The urinary bladder appears normal. The prostate gland and seminal vesicles are unremarkable.  Normal caliber of the abdominal aorta. No aneurysm. Mild calcified atherosclerotic change is noted involving the aorta. No enlarged retroperitoneal or mesenteric lymph nodes. There is no pelvic or inguinal adenopathy.  The stomach appears normal. The small bowel loops have a normal course and caliber without evidence for bowel obstruction. Normal appearance of the proximal colon. There is multiple distal colonic diverticula without acute inflammation.  No free fluid or fluid collections identified within the abdomen or pelvis. Small umbilical hernia contains fat only.  Review of the visualized osseous structures is significant for  degenerative disc disease at the L5-S1 level.  IMPRESSION: 1. No acute findings within the abdomen or pelvis. No evidence for bowel obstruction. 2. Atherosclerotic disease.   Electronically Signed   By: Signa Kell M.D.   On: 08/05/2014 19:02     EKG Interpretation   Date/Time:  Wednesday August 05 2014 11:29:58 EDT Ventricular Rate:  88 PR Interval:  138 QRS Duration: 81 QT Interval:  384 QTC Calculation: 465 R Axis:   91 Text Interpretation:  Sinus rhythm LAE, consider biatrial enlargement  Borderline right axis deviation ST elev, probable normal early repol  pattern Confirmed by Adriana Simas  MD, Kitara Hebb ([id number]) on 08/05/2014 12:30:40 PM      MDM   Final diagnoses:  Generalized abdominal pain  Liver enzyme elevation  Alcohol abuse    The pt admittedly is a chronic alcoholic which likely plays into this gastrointestinal illness - he has no fever, no leukocytosis, normal UA without infection but with mild ketones.  Will hydrate, zofran, check CMP for liver dysfunction, lipase, no indication for imaging at this time.  Doubt acute surgical process.  Orthostatics.   CT neg for acute findings  INitially had elevated AG but this improved with fluids - at d/c he was able to eat and drink without sx and felt much better, counseling re: use of alcohol and GI f/u was given, understenading expressed.   Meds given in ED:  Medications  dextrose 5 %-0.9 % sodium chloride infusion ( Intravenous Stopped 08/05/14 2119)  sodium chloride 0.9 % bolus 1,000 mL (0 mLs Intravenous Stopped 08/05/14 1335)  sodium chloride 0.9 % bolus 1,000 mL (0 mLs Intravenous Stopped 08/05/14 1617)  ondansetron (ZOFRAN) injection 4 mg (4 mg Intravenous Given 08/05/14 1335)  sodium chloride 0.9 % bolus 1,000 mL (0 mLs Intravenous Stopped 08/05/14 1933)  dextrose 50 % solution 25 mL (25 mLs Intravenous Given 08/05/14 1855)  LORazepam (ATIVAN) injection 1 mg (1 mg Intravenous Given 08/05/14 1858)  HYDROmorphone (DILAUDID) injection  0.5 mg (0.5 mg Intravenous Given 08/05/14 1859)  iohexol (OMNIPAQUE) 300 MG/ML solution 100 mL (100 mLs Intravenous Contrast Given 08/05/14 1837)    Discharge Medication List  as of 08/05/2014  9:09 PM    START taking these medications   Details  diphenoxylate-atropine (LOMOTIL) 2.5-0.025 MG per tablet Take 2 tablets by mouth 4 (four) times daily as needed for diarrhea or loose stools., Starting 08/05/2014, Until Discontinued, Print    ondansetron (ZOFRAN) 4 MG tablet Take 1 tablet (4 mg total) by mouth every 8 (eight) hours as needed for nausea or vomiting., Starting 08/05/2014, Until Discontinued, Print          Vida Roller, MD 08/05/14 2253

## 2014-08-05 NOTE — ED Notes (Signed)
Pt ambulating independently w/ steady gait on d/c in no acute distress, A&Ox4. Pt's friend to drive pt home.

## 2014-08-05 NOTE — ED Notes (Signed)
Pt eating crackers and drinking fluids at this time w/o difficulty - reports improvement in symptoms. Friend at bedside.

## 2014-08-05 NOTE — Discharge Instructions (Signed)
You have elevated liver tests - these need to be rechecked in the next 2 weeks - this will get worse if you keep drinking and will cause liver failure.  YOu should seek help through a substance abuse program - see below.   Emergency Department Resource Guide 1) Find a Doctor and Pay Out of Pocket Although you won't have to find out who is covered by your insurance plan, it is a good idea to ask around and get recommendations. You will then need to call the office and see if the doctor you have chosen will accept you as a new patient and what types of options they offer for patients who are self-pay. Some doctors offer discounts or will set up payment plans for their patients who do not have insurance, but you will need to ask so you aren't surprised when you get to your appointment.  2) Contact Your Local Health Department Not all health departments have doctors that can see patients for sick visits, but many do, so it is worth a call to see if yours does. If you don't know where your local health department is, you can check in your phone book. The CDC also has a tool to help you locate your state's health department, and many state websites also have listings of all of their local health departments.  3) Find a Walk-in Clinic If your illness is not likely to be very severe or complicated, you may want to try a walk in clinic. These are popping up all over the country in pharmacies, drugstores, and shopping centers. They're usually staffed by nurse practitioners or physician assistants that have been trained to treat common illnesses and complaints. They're usually fairly quick and inexpensive. However, if you have serious medical issues or chronic medical problems, these are probably not your [place] option.  No Primary Care Doctor: - Call Health Connect at  [phone] - they can help you locate a primary care doctor that  accepts your insurance, provides certain services, etc. - Physician Referral  Service- [phone]  Chronic Pain Problems: Organization         Address  Phone   Notes  [facility]  [phone] Patients need to be referred by their primary care doctor.   Medication Assistance: Organization         Address  Phone   Notes  [facility] Medication [facility] [address] [place] [place], [place] [address] [phone] --Must be a resident of [facility] -- Must have NO insurance coverage whatsoever (no Medicaid/ Medicare, etc.) -- The pt. MUST have a primary care doctor that directs their care regularly and follows them in the community   [facility]  [phone]   [employer]  [phone]    Agencies that provide inexpensive medical care: Organization         Address  Phone   Notes  [facility]  [phone]   [facility]    [phone]   [facility] [address] [place], [place] [address] [phone]   [facility] [address] [place]. [address] [place] [phone]   Planned Parenthood    [phone]   [facility]    [phone]   [facility]  [address], [place] Phone:  [phone], Fax:  [phone] Hours of Operation:  9 am - 6 pm, M-F.  Also  accepts Medicaid/Medicare and self-pay.  [facility]  [address], [place] Phone: [phone], Fax: [phone]. Hours of Operation:  8:30 am - 5:30 pm, M-F.  Also accepts Medicaid and self-pay.  [facility] [place] [address] [place] Phone: [phone]   [facility] [address] Natasha Bence [place], [place] [phone], Ext. [phone] Mondays & Thursdays: 7-9 AM.  First 15 patients are seen on a first come, first serve basis.    Medicaid-accepting [facility] Providers:  Organization         Address  Phone   Notes  [facility] [address], [place] [phone] Also accepts self-pay patients.  [facility] [address] Laurell Josephs [place], [place]  [phone]   [facility] [address], [place] [phone]   [facility] [address] [place] [phone]   Renaye Rakers [address], [place]   [phone] Only accepts [place] Access [place] patients after they have their name applied to their card.   Self-Pay (no insurance) in [facility]:  Organization         Address  Phone   Notes  Sickle Cell Patients, [facility] Internal Medicine [address] [place], [place] [phone]   [facility] [address] [place], [place] [phone]   [facility] [place]  [address], [place] [phone]   [facility]/Dr. Osei-Bonsu  [address] [place] or [address], [place] [phone] Phone number for both [place] and [place] locations is the same.  [facility]gent [facility] and [facility] [address], [place] [phone]   [facility] [address] [place] or [address] [phone]   [facility] [address] [place] [phone], phone; [phone], fax Sees patients 1st and 3rd Saturday of every month.  Must not qualify for public or private insurance (i.e. Medicaid, Medicare, [place] Health Choice, Veterans' Benefits)  Household income should be no more than 200% of the poverty level The clinic cannot treat you if you are pregnant or think you are pregnant  Sexually transmitted diseases are not treated at the clinic.    Dental Care: Organization         Address  Phone  Notes  [facility] Department of [facility] [address] [place], [place] [phone] Accepts children up to age 65 who are enrolled in  [place] or [place] Health Choice; pregnant women with a Medicaid card; and children who have applied for Medicaid or [place] Health Choice, but were declined, whose parents can pay a reduced fee at time of service.  [facility] Department of [facility]  [address], [place] [phone] Accepts children up to age 5 who are enrolled in [place] or [place] Health Choice; pregnant women with a Medicaid card; and children who have applied for Medicaid or [place] Health Choice, but were declined, whose parents can pay a reduced fee at time of service.  [facility]  [address] [place], [place] [phone] Patients are seen by appointment only. Walk-ins are not accepted. [facility] will see patients 7 years of age and older. Monday - Tuesday (8am-5pm)  Most Wednesdays (8:30-5pm) $30 per visit, cash only  [facility] Adult [employer] PROGRAM  [address], [facility] [phone] Patients are seen by appointment only. Walk-ins are not accepted. [place] will see patients 16 years of age and older. One Wednesday Evening (Monthly: Volunteer Based).  $30 per visit, cash only  [place]  [phone] for adults; Children under age 34, call Graduate Pediatric Dentistry at [phone]. Children aged 22-14, please call [phone] to request a pediatric application.  Dental services are provided in all areas of dental care including fillings, crowns and bridges, complete and partial dentures, implants, gum treatment, root canals, and extractions. Preventive care is also provided. Treatment is provided to both adults and children. Patients are selected via a lottery and there is often a waiting list.   [facility] [address], [place]  [phone] [email or web address]   [facility] [address] [place], [place] [phone], Ext. [phone] Second and Fourth Thursday of each month, opens at 6:30  AM; Clinic ends at 9 AM.  Patients are seen on a first-come first-served basis, and a limited number are seen during each clinic.   [facility]  [address] Hillard Danker [place], [place] [phone]   Eligibility Requirements You must have lived in [place], [place], or [place] counties for at least the last three months.   You cannot be eligible for state or federal sponsored [employer], including [employer], [place], or [employer].   You generally cannot be eligible for healthcare insurance through your employer.    How to apply: Eligibility screenings are held every Tuesday and Wednesday afternoon from 1:00 pm until 4:00 pm. You do not need an appointment for the interview!  [facility] [address] [place], [place]   [place]  [place] Department  [place]  [phone]    Behavioral Health Resources in the Community: Intensive Outpatient Programs Organization         Address  Phone  Notes  [place]. [address], [place], [place] [phone]   [facility] Outpatient [address], [place], [place]   ADS: Alcohol & Drug Svcs [address] [place], [place]   [place] [address]  [place], [place] or [phone]   Substance Abuse Resources Organization         Address  Phone  Notes  Alcohol and Drug Services  [phone]   [place]  [phone]   [facility]   Chinita Pester  [phone]   Residential & Outpatient Substance Abuse Program  [phone]   Psychological Services Organization         Address  Phone  Notes  [facility] [place]  [place]  [phone]   [place] [address] [place] or  [phone]    Mobile Crisis Teams Organization         Address  Phone  Notes  [facility], Mobile Crisis Care Unit  [phone]   [facility]  [address] [place], [place]   [place] Levels [address] [place] [phone]    Self-Help/Support Groups Organization         Address  Phone  Notes  [facility] [place] - variety of support groups  [place] Call for more information  Narcotics Anonymous (NA), [facility] [address], [employer] [place]  2 meetings at this location   Special educational needs teacher         Address  Phone  Notes  [facility] Residential Treatment [place],    [place]  [phone]   [facility]  [address] [place] [phone], [place], [place]   [place], [place] [phone] Admissions: 8am-3pm M-F  Incentives Substance [place] [address]    [place], [place] [phone]   [facility] [address] [place], [place], [place]   [facility] [address],  [place], [place]   [facility] - Intensive Outpatient [address] Kristeen [address], [place], [place]   [facility] ([place] [place].,  [place], [place] [phone] or [phone]   [facility] (RT[facility]) [address] [place], [place] Accepts Medicaid  [facility] [place] [address],  [place] [phone] Substance Abuse/Addiction Treatment   [facility] Organization         Address  Phone  Notes  [facility]  [phone]   Domenic Schwab, PhD [address] Arlis Porta [place], [place]   [phone] or [phone]   [place] and [place], [place] [phone]   [facility] [address],  [place], [place] [phone] Insurance/Medicaid/sponsorship through [facility] and Families [address] [place] [address] [place]                                    [place], [place] [phone] [place] [address] [place], [place] [phone]    Dr. Adele Schilder  [phone]   [facility] Dept. 1) [address], [place] 2) [place] 3)  [place] [address], [place] [phone]  [phone]   [place] [phone] or [phone] (After Hours)

## 2014-08-05 NOTE — ED Notes (Signed)
Pt c/o n/v/d for over 1 week.  Reports has passed out several times today.

## 2014-08-20 ENCOUNTER — Encounter (INDEPENDENT_AMBULATORY_CARE_PROVIDER_SITE_OTHER): Payer: Self-pay | Admitting: Internal Medicine

## 2014-08-20 ENCOUNTER — Ambulatory Visit (INDEPENDENT_AMBULATORY_CARE_PROVIDER_SITE_OTHER): Payer: Self-pay | Admitting: Internal Medicine

## 2014-08-20 ENCOUNTER — Encounter (INDEPENDENT_AMBULATORY_CARE_PROVIDER_SITE_OTHER): Payer: Self-pay | Admitting: *Deleted

## 2014-08-20 VITALS — BP 92/52 | HR 80 | Temp 97.9°F | Ht 68.0 in | Wt 117.0 lb

## 2014-08-20 DIAGNOSIS — R197 Diarrhea, unspecified: Secondary | ICD-10-CM

## 2014-08-20 DIAGNOSIS — R634 Abnormal weight loss: Secondary | ICD-10-CM | POA: Insufficient documentation

## 2014-08-20 NOTE — Patient Instructions (Signed)
CBC, CMET, Lipase. OV in 2 months.

## 2014-08-20 NOTE — Progress Notes (Signed)
Subjective:    Patient ID: Jason Lamb, male    DOB: Mar 01, 1961, 53 y.o.   MRN: [id number]  HPI Presence today with c/o diarrhea. Just underwent an EGD and Colonoscopy for diarrhea. (see below). He his having 2 stools in am which are mushy. Has not seen any blood. Stools are brown in color.  Appetite is okay, but not great. He has lost 4 pounds since his last visit in August. He points to left side abdominal pain.  He is drinking 4-5 beers a day. He cut back 08/05/2014.   04/08/2014 EGD/Colonoscopy Dr. Karilyn Cota: Melena, diarrhea;  Erosive reflux esophagitis.  Nonerosive gastroduodenitis.  Colonoscopy findings;  Normal mucosa of terminal ileum.  Scattered diverticula throughout the colon.  Small external hemorrhoids and anal papillae.  Comment;  Some of patient's symptoms are suggestive of IBS.  Rest of the symptoms last be secondary to excessive alcohol intake.   LFTs are normal. Hepatitis B surface antigen and hepatitis C virus antibody are negative. H. pylori serology is positive. He was treated with Pylera.  Results reviewed with patient. He reports feeling much better. He is eating and not having nausea vomiting or abdominal pain .   08/05/2014 CT abdomen/pelvis with CM: abdominal pain, emesis.  IMPRESSION:  1. No acute findings within the abdomen or pelvis. No evidence for  bowel obstruction.  2. Atherosclerotic disease.   CBC    Component Value Date/Time   WBC 9.6 08/05/2014 1236   RBC 4.87 08/05/2014 1236   HGB 15.1 08/05/2014 1236   HCT 42.9 08/05/2014 1236   PLT 323 08/05/2014 1236   MCV 88.1 08/05/2014 1236   MCH 31.0 08/05/2014 1236   MCHC 35.2 08/05/2014 1236   RDW 14.0 08/05/2014 1236   LYMPHSABS 2.2 08/05/2014 1236   MONOABS 0.7 08/05/2014 1236   EOSABS 0.0 08/05/2014 1236   BASOSABS 0.1 08/05/2014 1236    CMP     Component Value Date/Time   NA 136* 08/05/2014 2001   K 3.9 08/05/2014 2001   CL 101 08/05/2014 2001   CO2 19 08/05/2014 2001   GLUCOSE 274* 08/05/2014 2001   BUN 4*  08/05/2014 2001   CREATININE 0.65 08/05/2014 2001   CALCIUM 7.5* 08/05/2014 2001   PROT 7.6 08/05/2014 1236   ALBUMIN 4.6 08/05/2014 1236   AST 132* 08/05/2014 1236   ALT 143* 08/05/2014 1236   ALKPHOS 63 08/05/2014 1236   BILITOT 0.6 08/05/2014 1236   GFRNONAA >90 08/05/2014 2001   GFRAA >90 08/05/2014 2001          Review of Systems Past Medical History  Diagnosis Date  . Bronchitis   . GERD (gastroesophageal reflux disease)   . Graves disease   . Depression   . Anxiety     Past Surgical History  Procedure Laterality Date  . Appendectomy    . Colonoscopy N/A 04/08/2014    Procedure: COLONOSCOPY;  Surgeon: Malissa Hippo, MD;  Location: [facility];  Service: Endoscopy;  Laterality: N/A;  135  . Esophagogastroduodenoscopy N/A 04/08/2014    Procedure: ESOPHAGOGASTRODUODENOSCOPY (EGD);  Surgeon: Malissa Hippo, MD;  Location: [facility];  Service: Endoscopy;  Laterality: N/A;    Allergies  Allergen Reactions  . Penicillins Other (See Comments)    Childhood allergy    Current Outpatient Prescriptions on File Prior to Visit  Medication Sig Dispense Refill  . ALPRAZolam (XANAX) 1 MG tablet Take 1-2 mg by mouth at bedtime.       Marland Kitchen  dicyclomine (BENTYL) 10 MG capsule TAKE 1 CAPSULE (10 MG TOTAL)  BY MOUTH 3 (THREE) TIMES DAILY BEFORE MEA LS.  90 capsule  5  . diphenoxylate-atropine (LOMOTIL) 2.5-0.025 MG per tablet Take 2 tablets by mouth 4 (four) times daily as needed for diarrhea or loose stools.  30 tablet  0  . HYDROcodone-acetaminophen (NORCO/VICODIN) 5-325 MG per tablet Take 1 tablet by mouth 3 (three) times daily as needed for moderate pain.       . meloxicam (MOBIC) 7.5 MG tablet Take 7.5 mg by mouth 2 (two) times daily before a meal.      . ondansetron (ZOFRAN) 4 MG tablet Take 1 tablet (4 mg total) by mouth every 8 (eight) hours as needed for nausea or vomiting.  10 tablet  0  . pantoprazole (PROTONIX) 40 MG tablet Take 1 tablet (40 mg total) by mouth 2 (two) times daily before  a meal.  180 tablet  3   No current facility-administered medications on file prior to visit.        Objective:   Physical Exam  Filed Vitals:   08/20/14 1441  BP: 92/52  Pulse: 80  Temp: 97.9 F (36.6 C)  Height:  (1.727 m)  Weight: 117 lb (53.071 kg)  Alert and oriented. Skin warm and dry. Oral mucosa is moist.   . Sclera anicteric, conjunctivae is pink. Thyroid not enlarged. No cervical lymphadenopathy. Lungs clear. Heart regular rate and rhythm.  Abdomen is soft. Bowel sounds are positive. No hepatomegaly. No abdominal masses felt. ?Tenderness left upper quadrant.  No edema to lower extremities.           Assessment & Plan:  Diarrhea better since he has slowed his drinking down. Got up this am and drank a beer (tall beer). I think the root of his problems is his etoh consumption.  CBC, CMET, Lipase, cortisol level.  Patient need to stop drinking entirely.

## 2014-08-21 LAB — COMPREHENSIVE METABOLIC PANEL
ALBUMIN: 4.7 g/dL (ref 3.5–5.2)
ALT: 26 U/L (ref 0–53)
AST: 28 U/L (ref 0–37)
Alkaline Phosphatase: 65 U/L (ref 39–117)
BUN: 4 mg/dL — AB (ref 6–23)
CHLORIDE: 104 meq/L (ref 96–112)
CO2: 25 mEq/L (ref 19–32)
Calcium: 9.4 mg/dL (ref 8.4–10.5)
Creat: 0.69 mg/dL (ref 0.50–1.35)
GLUCOSE: 59 mg/dL — AB (ref 70–99)
POTASSIUM: 4 meq/L (ref 3.5–5.3)
Sodium: 139 mEq/L (ref 135–145)
Total Bilirubin: 0.3 mg/dL (ref 0.2–1.2)
Total Protein: 7.1 g/dL (ref 6.0–8.3)

## 2014-08-21 LAB — CBC WITH DIFFERENTIAL/PLATELET
Basophils Absolute: 0.1 10*3/uL (ref 0.0–0.1)
Basophils Relative: 1 % (ref 0–1)
Eosinophils Absolute: 0.3 10*3/uL (ref 0.0–0.7)
Eosinophils Relative: 3 % (ref 0–5)
HEMATOCRIT: 42.1 % (ref 39.0–52.0)
HEMOGLOBIN: 14 g/dL (ref 13.0–17.0)
LYMPHS ABS: 2.7 10*3/uL (ref 0.7–4.0)
LYMPHS PCT: 30 % (ref 12–46)
MCH: 30.2 pg (ref 26.0–34.0)
MCHC: 33.3 g/dL (ref 30.0–36.0)
MCV: 90.7 fL (ref 78.0–100.0)
MONO ABS: 0.7 10*3/uL (ref 0.1–1.0)
MONOS PCT: 8 % (ref 3–12)
NEUTROS PCT: 58 % (ref 43–77)
Neutro Abs: 5.2 10*3/uL (ref 1.7–7.7)
Platelets: 326 10*3/uL (ref 150–400)
RBC: 4.64 MIL/uL (ref 4.22–5.81)
RDW: 14.6 % (ref 11.5–15.5)
WBC: 8.9 10*3/uL (ref 4.0–10.5)

## 2014-08-21 LAB — CORTISOL: CORTISOL PLASMA: 8.1 ug/dL

## 2014-08-21 LAB — LIPASE: Lipase: 51 U/L (ref 0–75)

## 2014-10-20 ENCOUNTER — Ambulatory Visit (INDEPENDENT_AMBULATORY_CARE_PROVIDER_SITE_OTHER): Payer: Self-pay | Admitting: Internal Medicine

## 2014-11-18 ENCOUNTER — Encounter (INDEPENDENT_AMBULATORY_CARE_PROVIDER_SITE_OTHER): Payer: Self-pay | Admitting: *Deleted

## 2014-11-18 ENCOUNTER — Telehealth (INDEPENDENT_AMBULATORY_CARE_PROVIDER_SITE_OTHER): Payer: Self-pay | Admitting: *Deleted

## 2014-11-18 NOTE — Telephone Encounter (Signed)
Odis LusterJames NO SHOWED for his apt on 10/20/14 with Dorene Arerri Setzer, NP. A NS letter has been mailed.

## 2015-04-12 ENCOUNTER — Other Ambulatory Visit (INDEPENDENT_AMBULATORY_CARE_PROVIDER_SITE_OTHER): Payer: Self-pay | Admitting: Internal Medicine

## 2015-04-13 ENCOUNTER — Encounter (INDEPENDENT_AMBULATORY_CARE_PROVIDER_SITE_OTHER): Payer: Self-pay | Admitting: *Deleted

## 2015-06-02 ENCOUNTER — Emergency Department (HOSPITAL_COMMUNITY): Payer: 59

## 2015-06-02 ENCOUNTER — Emergency Department (HOSPITAL_COMMUNITY)
Admission: EM | Admit: 2015-06-02 | Discharge: 2015-06-02 | Disposition: A | Discharge status: Home / Self Care | Payer: 59 | Attending: Emergency Medicine | Admitting: Emergency Medicine

## 2015-06-02 ENCOUNTER — Encounter (HOSPITAL_COMMUNITY): Payer: Self-pay | Admitting: Emergency Medicine

## 2015-06-02 DIAGNOSIS — K2970 Gastritis, unspecified, without bleeding: Secondary | ICD-10-CM | POA: Insufficient documentation

## 2015-06-02 DIAGNOSIS — Z8709 Personal history of other diseases of the respiratory system: Secondary | ICD-10-CM | POA: Diagnosis not present

## 2015-06-02 DIAGNOSIS — K219 Gastro-esophageal reflux disease without esophagitis: Secondary | ICD-10-CM | POA: Diagnosis not present

## 2015-06-02 DIAGNOSIS — Z79899 Other long term (current) drug therapy: Secondary | ICD-10-CM | POA: Insufficient documentation

## 2015-06-02 DIAGNOSIS — Z720 Tobacco use: Secondary | ICD-10-CM | POA: Diagnosis not present

## 2015-06-02 DIAGNOSIS — R1013 Epigastric pain: Secondary | ICD-10-CM | POA: Diagnosis present

## 2015-06-02 DIAGNOSIS — Z880 Allergy status to penicillin: Secondary | ICD-10-CM | POA: Insufficient documentation

## 2015-06-02 DIAGNOSIS — F419 Anxiety disorder, unspecified: Secondary | ICD-10-CM | POA: Insufficient documentation

## 2015-06-02 DIAGNOSIS — Z8639 Personal history of other endocrine, nutritional and metabolic disease: Secondary | ICD-10-CM | POA: Insufficient documentation

## 2015-06-02 DIAGNOSIS — R109 Unspecified abdominal pain: Secondary | ICD-10-CM

## 2015-06-02 DIAGNOSIS — Z791 Long term (current) use of non-steroidal anti-inflammatories (NSAID): Secondary | ICD-10-CM | POA: Diagnosis not present

## 2015-06-02 DIAGNOSIS — F329 Major depressive disorder, single episode, unspecified: Secondary | ICD-10-CM | POA: Diagnosis not present

## 2015-06-02 DIAGNOSIS — Z9089 Acquired absence of other organs: Secondary | ICD-10-CM | POA: Diagnosis not present

## 2015-06-02 LAB — COMPREHENSIVE METABOLIC PANEL
ALT: 15 U/L — ABNORMAL LOW (ref 17–63)
AST: 19 U/L (ref 15–41)
Albumin: 4.8 g/dL (ref 3.5–5.0)
Alkaline Phosphatase: 85 U/L (ref 38–126)
Anion gap: 11 (ref 5–15)
BUN: 8 mg/dL (ref 6–20)
CALCIUM: 9.4 mg/dL (ref 8.9–10.3)
CO2: 23 mmol/L (ref 22–32)
CREATININE: 0.8 mg/dL (ref 0.61–1.24)
Chloride: 105 mmol/L (ref 101–111)
GFR calc non Af Amer: >60 mL/min (ref >60)
Glucose, Bld: 98 mg/dL (ref 65–99)
Potassium: 3.6 mmol/L (ref 3.5–5.1)
SODIUM: 139 mmol/L (ref 135–145)
TOTAL PROTEIN: 7.9 g/dL (ref 6.5–8.1)
Total Bilirubin: 0.9 mg/dL (ref 0.3–1.2)

## 2015-06-02 LAB — CBC WITH DIFFERENTIAL/PLATELET
BASOS ABS: 0 10*3/uL (ref 0.0–0.1)
Basophils Relative: 0 % (ref 0–1)
EOS PCT: 1 % (ref 0–5)
Eosinophils Absolute: 0.1 10*3/uL (ref 0.0–0.7)
HEMATOCRIT: 44.6 % (ref 39.0–52.0)
Hemoglobin: 15.7 g/dL (ref 13.0–17.0)
LYMPHS ABS: 2.3 10*3/uL (ref 0.7–4.0)
Lymphocytes Relative: 21 % (ref 12–46)
MCH: 29.5 pg (ref 26.0–34.0)
MCHC: 35.2 g/dL (ref 30.0–36.0)
MCV: 83.7 fL (ref 78.0–100.0)
MONO ABS: 0.8 10*3/uL (ref 0.1–1.0)
MONOS PCT: 8 % (ref 3–12)
Neutro Abs: 7.6 10*3/uL (ref 1.7–7.7)
Neutrophils Relative %: 70 % (ref 43–77)
PLATELETS: 317 10*3/uL (ref 150–400)
RBC: 5.33 MIL/uL (ref 4.22–5.81)
RDW: 12.9 % (ref 11.5–15.5)
WBC: 10.9 10*3/uL — AB (ref 4.0–10.5)

## 2015-06-02 LAB — TYPE AND SCREEN
ABO/RH(D): A POS
ANTIBODY SCREEN: NEGATIVE

## 2015-06-02 LAB — LIPASE, BLOOD: LIPASE: 22 U/L (ref 22–51)

## 2015-06-02 LAB — PROTIME-INR
INR: 1.09 (ref 0.00–1.49)
Prothrombin Time: 14.3 seconds (ref 11.6–15.2)

## 2015-06-02 LAB — POC OCCULT BLOOD, ED: FECAL OCCULT BLD: NEGATIVE

## 2015-06-02 MED ORDER — SODIUM CHLORIDE 0.9 % IV SOLN
Freq: Once | INTRAVENOUS | Status: AC
  Administered 2015-06-02: 07:00:00 via INTRAVENOUS

## 2015-06-02 MED ORDER — METRONIDAZOLE 250 MG PO TABS: 250.0000 mg | ORAL_TABLET | Freq: Four times a day (QID) | ORAL | Status: DC

## 2015-06-02 MED ORDER — BISMUTH SUBSALICYLATE 262 MG PO TABS
524.0000 mg | ORAL_TABLET | Freq: Four times a day (QID) | ORAL | Status: DC
Start: 2015-06-02 — End: 2015-09-06

## 2015-06-02 MED ORDER — DOXYCYCLINE HYCLATE 100 MG PO CAPS: 100.0000 mg | ORAL_CAPSULE | Freq: Two times a day (BID) | ORAL | Status: DC

## 2015-06-02 MED ORDER — SUCRALFATE 1 GM/10ML PO SUSP: 1.0000 g | Freq: Three times a day (TID) | ORAL | Status: DC

## 2015-06-02 MED ORDER — ONDANSETRON HCL 4 MG/2ML IJ SOLN
4.0000 mg | Freq: Once | INTRAMUSCULAR | Status: AC
  Administered 2015-06-02: 4 mg via INTRAVENOUS
  Filled 2015-06-02: qty 2

## 2015-06-02 NOTE — ED Notes (Signed)
Hemoccult collected. Hemoccult negative. Pt reports took pepto bismol for symptoms at home since Thursday.

## 2015-06-02 NOTE — ED Notes (Signed)
Pt reports lower abdominal pain,nausea, "black stools" since Thursday.

## 2015-06-02 NOTE — Discharge Instructions (Signed)

## 2015-06-02 NOTE — ED Provider Notes (Signed)
CSN: [id number]     Arrival date & time 06/02/15  1191 History   First MD Initiated Contact with Patient 06/02/15 [phone]     Chief Complaint  Patient presents with  . Abdominal Pain     (Consider location/radiation/quality/duration/timing/severity/associated sxs/prior Treatment) HPI Comments: Patient with history of GERD and previous upper GI bleed secondary to gastroduodenitis presents to the ER for evaluation of one week of upper abdominal pain. Patient reports a burning pain that is constant in the upper abdomen and has had black stools. Patient reports that he has been feeling progressively more weak and feels like he is going to pass out. He has not had any hematemesis. He reports constant nausea and retching, but has not been able to vomit.  Patient is a 54 y.o. male presenting with abdominal pain.  Abdominal Pain Associated symptoms: fatigue     Past Medical History  Diagnosis Date  . Bronchitis   . GERD (gastroesophageal reflux disease)   . Graves disease   . Depression   . Anxiety    Past Surgical History  Procedure Laterality Date  . Appendectomy    . Colonoscopy N/A 04/08/2014    Procedure: COLONOSCOPY;  Surgeon: Malissa Hippo, MD;  Location: [facility];  Service: Endoscopy;  Laterality: N/A;  135  . Esophagogastroduodenoscopy N/A 04/08/2014    Procedure: ESOPHAGOGASTRODUODENOSCOPY (EGD);  Surgeon: Malissa Hippo, MD;  Location: [facility];  Service: Endoscopy;  Laterality: N/A;   History reviewed. No pertinent family history. History  Substance Use Topics  . Smoking status: Current Every Day Smoker -- 1.00 packs/day for 35 years    Types: Cigarettes  . Smokeless tobacco: Never Used     Comment: 1 pack a day  . Alcohol Use: 7.2 oz/week    12 Cans of beer per week     Comment: daily x 12 beers a day usually.    Review of Systems  Constitutional: Positive for fatigue.  Gastrointestinal: Positive for abdominal pain.  All other systems reviewed and are  negative.     Allergies  Penicillins  Home Medications   Prior to Admission medications   Medication Sig Start Date End Date Taking? Authorizing Provider  ALPRAZolam Prudy Feeler) 1 MG tablet Take 1-2 mg by mouth at bedtime.     Historical Provider, MD  dicyclomine (BENTYL) 10 MG capsule TAKE 1 CAPSULE (10 MG TOTAL)  BY MOUTH 3 (THREE) TIMES DAILY BEFORE MEA LS.    Malissa Hippo, MD  diphenoxylate-atropine (LOMOTIL) 2.5-0.025 MG per tablet Take 2 tablets by mouth 4 (four) times daily as needed for diarrhea or loose stools. 08/05/14   Eber Hong, MD  HYDROcodone-acetaminophen (NORCO/VICODIN) 5-325 MG per tablet Take 1 tablet by mouth 3 (three) times daily as needed for moderate pain.     Historical Provider, MD  meloxicam (MOBIC) 7.5 MG tablet Take 7.5 mg by mouth 2 (two) times daily before a meal.    Historical Provider, MD  ondansetron (ZOFRAN) 4 MG tablet Take 1 tablet (4 mg total) by mouth every 8 (eight) hours as needed for nausea or vomiting. 08/05/14   Eber Hong, MD  pantoprazole (PROTONIX) 40 MG tablet TAKE 1 TABLET (40 MG TOTAL) BY MOUTH 2 (TWO) TIMES DAILY  BEFORE A MEAL . 04/12/15   Malissa Hippo, MD   BP 127/80 mmHg  Pulse 65  Temp(Src) 98.2 F (36.8 C) (Oral)  Resp 14  Ht  (1.727 m)  Wt 130 lb (58.968 kg)  BMI  19.77 kg/m2  SpO2 99% Physical Exam  Constitutional: He is oriented to person, place, and time. He appears well-developed and well-nourished. No distress.  HENT:  Head: Normocephalic and atraumatic.  Right Ear: Hearing normal.  Left Ear: Hearing normal.  Nose: Nose normal.  Mouth/Throat: Oropharynx is clear and moist and mucous membranes are normal.  Eyes: Conjunctivae and EOM are normal. Pupils are equal, round, and reactive to light.  Neck: Normal range of motion. Neck supple.  Cardiovascular: Regular rhythm, S1 normal and S2 normal.  Exam reveals no gallop and no friction rub.   No murmur heard. Pulmonary/Chest: Effort normal and breath sounds normal.  No respiratory distress. He exhibits no tenderness.  Abdominal: Soft. Normal appearance and bowel sounds are normal. There is no hepatosplenomegaly. There is tenderness in the epigastric area. There is no rebound, no guarding, no tenderness at McBurney's point and negative Murphy's sign. No hernia.  Musculoskeletal: Normal range of motion.  Neurological: He is alert and oriented to person, place, and time. He has normal strength. No cranial nerve deficit or sensory deficit. Coordination normal. GCS eye subscore is 4. GCS verbal subscore is 5. GCS motor subscore is 6.  Skin: Skin is warm, dry and intact. No rash noted. No cyanosis.  Psychiatric: He has a normal mood and affect. His speech is normal and behavior is normal. Thought content normal.  Nursing note and vitals reviewed.   ED Course  Procedures (including critical care time) Labs Review Labs Reviewed  CBC WITH DIFFERENTIAL/PLATELET - Abnormal; Notable for the following:    WBC 10.9 (*)    All other components within normal limits  COMPREHENSIVE METABOLIC PANEL - Abnormal; Notable for the following:    ALT 15 (*)    All other components within normal limits  LIPASE, BLOOD  PROTIME-INR  H. PYLORI ANTIBODY, IGG  POC OCCULT BLOOD, ED  TYPE AND SCREEN    Imaging Review Dg Abd Acute W/chest  06/02/2015   CLINICAL DATA:  Six day history abdominal pain with nausea, vomiting, and diarrhea  EXAM: DG ABDOMEN ACUTE W/ 1V CHEST  COMPARISON:  Chest radiograph February 04, 2014; CT abdomen and pelvis August 05, 2014  FINDINGS: PA chest: There is no edema or consolidation. Heart size and pulmonary vascularity are normal. No adenopathy.  Supine and upright abdomen: There is mild to moderate stool in the colon. There is no bowel dilatation or air-fluid level suggesting obstruction. No free air. No abnormal calcifications.  IMPRESSION: Bowel gas pattern unremarkable. No obstruction or free air. Lungs clear.   Electronically Signed   By: Bretta Bang III M.D.   On: 06/02/2015 08:12     EKG Interpretation   Date/Time:  Wednesday June 02 2015 07:42:57 EDT Ventricular Rate:  62 PR Interval:  150 QRS Duration: 83 QT Interval:  450 QTC Calculation: 457 R Axis:   73 Text Interpretation:  Sinus rhythm Normal ECG Confirmed by Lakie Mclouth  MD,  Isais Klipfel ([id number]) on 06/02/2015 8:05:38 AM      MDM   Final diagnoses:  Abdominal pain   gastritis  Patient presents to the ER for evaluation of upper abdominal discomfort. Reviewing his records reveals that he does have a history of gastroduodenitis resulting in upper GI bleed. He reports black stools, but has been taking Pepto-Bismol. Rectal exam did reveal dark stools but they're heme-negative. His vital signs are normal. All blood work was normal as well including hemoglobin. Etiology of previous upper GI bleed was H. pylori infection. Will reinitiate  treatment empirically. Follow-up with his GI doctor.    Gilda Creasehristopher J Dawsen Krieger, MD 06/02/15 [phone]

## 2015-06-03 LAB — H. PYLORI ANTIBODY, IGG: H PYLORI IGG: 2.8 U/mL — AB (ref 0.0–0.8)

## 2015-08-25 ENCOUNTER — Ambulatory Visit (INDEPENDENT_AMBULATORY_CARE_PROVIDER_SITE_OTHER): Payer: Self-pay | Admitting: Internal Medicine

## 2015-09-06 ENCOUNTER — Ambulatory Visit (INDEPENDENT_AMBULATORY_CARE_PROVIDER_SITE_OTHER): Payer: 59 | Admitting: Internal Medicine

## 2015-09-06 ENCOUNTER — Encounter (INDEPENDENT_AMBULATORY_CARE_PROVIDER_SITE_OTHER): Payer: Self-pay | Admitting: *Deleted

## 2015-09-06 ENCOUNTER — Encounter (INDEPENDENT_AMBULATORY_CARE_PROVIDER_SITE_OTHER): Payer: Self-pay | Admitting: Internal Medicine

## 2015-09-06 VITALS — BP 92/70 | HR 80 | Temp 98.4°F | Ht 68.0 in | Wt 118.0 lb

## 2015-09-06 DIAGNOSIS — K219 Gastro-esophageal reflux disease without esophagitis: Secondary | ICD-10-CM

## 2015-09-06 NOTE — Progress Notes (Addendum)
Subjective:    Patient ID: Jason Lamb, male    DOB: 1961-11-18, 54 y.o.   MRN: [id number]  HPI Her today for f/u. He was last seen in September of 2015. Hx of diarrhea and underwent a colonoscopy and EGD in 2015 for melena and diarrhea. (.See below). Recently seen in ED in July with epigastric pain. Found to have H. Pylori and was treated with Pepto Bismol, Doxycycline, and Flagy. Today he tells me he feels good.  There is not abdominal pain. He takes Meloxicam BID. His appetite is good for the most part. He has maintained his weight from last year. He tells me he quit drinking in October of 2015. He is having one BM a day. No melena or BRRB.     CBC    Component Value Date/Time   WBC 10.9* 06/02/2015 0735   RBC 5.33 06/02/2015 0735   HGB 15.7 06/02/2015 0735   HCT 44.6 06/02/2015 0735   PLT 317 06/02/2015 0735   MCV 83.7 06/02/2015 0735   MCH 29.5 06/02/2015 0735   MCHC 35.2 06/02/2015 0735   RDW 12.9 06/02/2015 0735   LYMPHSABS 2.3 06/02/2015 0735   MONOABS 0.8 06/02/2015 0735   EOSABS 0.1 06/02/2015 0735   BASOSABS 0.0 06/02/2015 0735   Hepatic Function Panel     Component Value Date/Time   PROT 7.9 06/02/2015 0735   ALBUMIN 4.8 06/02/2015 0735   AST 19 06/02/2015 0735   ALT 15* 06/02/2015 0735   ALKPHOS 85 06/02/2015 0735   BILITOT 0.9 06/02/2015 0735   BILIDIR <0.2 04/08/2014 1520   IBILI NOT CALCULATED 04/08/2014 1520         04/08/2014 EGD/Colonoscopy Dr. Karilyn Cota: Melena, diarrhea;  Erosive reflux esophagitis.  Nonerosive gastroduodenitis.  Colonoscopy findings;  Normal mucosa of terminal ileum.  Scattered diverticula throughout the colon.  Small external hemorrhoids and anal papillae.  Comment;  Some of patient's symptoms are suggestive of IBS.  Rest of the symptoms last be secondary to excessive alcohol intake.   LFTs are normal. Hepatitis B surface antigen and hepatitis C virus antibody are negative. H. pylori serology is positive. He  was treated with Pylera.  Results reviewed with patient. He reports feeling much better. He is eating and not having nausea vomiting or abdominal pain .   08/05/2014 CT abdomen/pelvis with CM: abdominal pain, emesis.  IMPRESSION:  1. No acute findings within the abdomen or pelvis. No evidence for  bowel obstruction.  2. Atherosclerotic disease.         Review of Systems Past Medical History  Diagnosis Date  . Bronchitis   . GERD (gastroesophageal reflux disease)   . Graves disease   . Depression   . Anxiety     Past Surgical History  Procedure Laterality Date  . Appendectomy    . Colonoscopy N/A 04/08/2014    Procedure: COLONOSCOPY;  Surgeon: Malissa Hippo, MD;  Location: [facility];  Service: Endoscopy;  Laterality: N/A;  135  . Esophagogastroduodenoscopy N/A 04/08/2014    Procedure: ESOPHAGOGASTRODUODENOSCOPY (EGD);  Surgeon: Malissa Hippo, MD;  Location: [facility];  Service: Endoscopy;  Laterality: N/A;    Allergies  Allergen Reactions  . Penicillins Other (See Comments)    Childhood allergy    Current Outpatient Prescriptions on File Prior to Visit  Medication Sig Dispense Refill  . ALPRAZolam (XANAX) 1 MG tablet Take 1-2 mg by mouth at bedtime.     . dicyclomine (BENTYL) 10 MG capsule TAKE 1  CAPSULE (10 MG TOTAL)  BY MOUTH 3 (THREE) TIMES DAILY BEFORE MEA LS. 90 capsule 5  . HYDROcodone-acetaminophen (NORCO/VICODIN) 5-325 MG per tablet Take 1 tablet by mouth 3 (three) times daily as needed for moderate pain.     . meloxicam (MOBIC) 7.5 MG tablet Take 7.5 mg by mouth 2 (two) times daily before a meal.    . pantoprazole (PROTONIX) 40 MG tablet TAKE 1 TABLET (40 MG TOTAL) BY MOUTH 2 (TWO) TIMES DAILY  BEFORE A MEAL . 60 tablet 5   No current facility-administered medications on file prior to visit.        Objective:   Physical Exam Blood pressure 92/70, pulse 80, temperature 98.4 F (36.9 C), height  (1.727 m), weight 118 lb (53.524 kg). Alert  and oriented. Skin warm and dry. Oral mucosa is moist.   . Sclera anicteric, conjunctivae is pink. Thyroid not enlarged. No cervical lymphadenopathy. Lungs clear. Heart regular rate and rhythm.  Abdomen is soft. Bowel sounds are positive. No hepatomegaly. No abdominal masses felt. Slight epigastric tenderness.  No edema to lower extremities.         Assessment & Plan:  GERD. He seems to be doing better. No diarrhea now since stopping drinking. Advised he should come off the Meloxicam. OV in 1 year.

## 2015-09-06 NOTE — Patient Instructions (Addendum)
Continue present medication.  Stop Meloxicam if you can. Continue Protonix and Bentyl OV in 1 year.

## 2015-09-23 ENCOUNTER — Other Ambulatory Visit (INDEPENDENT_AMBULATORY_CARE_PROVIDER_SITE_OTHER): Payer: Self-pay | Admitting: Internal Medicine

## 2015-12-24 ENCOUNTER — Emergency Department (HOSPITAL_COMMUNITY)
Admission: EM | Admit: 2015-12-24 | Discharge: 2015-12-24 | Disposition: A | Discharge status: Home / Self Care | Payer: BLUE CROSS/BLUE SHIELD | Attending: Emergency Medicine | Admitting: Emergency Medicine

## 2015-12-24 ENCOUNTER — Encounter (HOSPITAL_COMMUNITY): Payer: Self-pay

## 2015-12-24 ENCOUNTER — Emergency Department (HOSPITAL_COMMUNITY): Payer: BLUE CROSS/BLUE SHIELD

## 2015-12-24 DIAGNOSIS — Z880 Allergy status to penicillin: Secondary | ICD-10-CM | POA: Diagnosis not present

## 2015-12-24 DIAGNOSIS — K219 Gastro-esophageal reflux disease without esophagitis: Secondary | ICD-10-CM | POA: Insufficient documentation

## 2015-12-24 DIAGNOSIS — F17210 Nicotine dependence, cigarettes, uncomplicated: Secondary | ICD-10-CM | POA: Insufficient documentation

## 2015-12-24 DIAGNOSIS — Z8709 Personal history of other diseases of the respiratory system: Secondary | ICD-10-CM | POA: Diagnosis not present

## 2015-12-24 DIAGNOSIS — Z8639 Personal history of other endocrine, nutritional and metabolic disease: Secondary | ICD-10-CM | POA: Diagnosis not present

## 2015-12-24 DIAGNOSIS — H81399 Other peripheral vertigo, unspecified ear: Secondary | ICD-10-CM | POA: Diagnosis not present

## 2015-12-24 DIAGNOSIS — F329 Major depressive disorder, single episode, unspecified: Secondary | ICD-10-CM | POA: Insufficient documentation

## 2015-12-24 DIAGNOSIS — F419 Anxiety disorder, unspecified: Secondary | ICD-10-CM | POA: Insufficient documentation

## 2015-12-24 DIAGNOSIS — K589 Irritable bowel syndrome without diarrhea: Secondary | ICD-10-CM | POA: Diagnosis not present

## 2015-12-24 DIAGNOSIS — Z79899 Other long term (current) drug therapy: Secondary | ICD-10-CM | POA: Diagnosis not present

## 2015-12-24 DIAGNOSIS — G8929 Other chronic pain: Secondary | ICD-10-CM | POA: Insufficient documentation

## 2015-12-24 DIAGNOSIS — R42 Dizziness and giddiness: Secondary | ICD-10-CM | POA: Diagnosis present

## 2015-12-24 HISTORY — DX: Irritable bowel syndrome, unspecified: K58.9

## 2015-12-24 HISTORY — DX: Other chronic pain: G89.29

## 2015-12-24 HISTORY — DX: Dorsalgia, unspecified: M54.9

## 2015-12-24 LAB — CBC WITH DIFFERENTIAL/PLATELET
BASOS ABS: 0 10*3/uL (ref 0.0–0.1)
BASOS PCT: 1 %
Eosinophils Absolute: 0.2 10*3/uL (ref 0.0–0.7)
Eosinophils Relative: 3 %
HEMATOCRIT: 41.2 % (ref 39.0–52.0)
HEMOGLOBIN: 13.9 g/dL (ref 13.0–17.0)
Lymphocytes Relative: 30 %
Lymphs Abs: 2.5 10*3/uL (ref 0.7–4.0)
MCH: 29.4 pg (ref 26.0–34.0)
MCHC: 33.7 g/dL (ref 30.0–36.0)
MCV: 87.1 fL (ref 78.0–100.0)
Monocytes Absolute: 0.5 10*3/uL (ref 0.1–1.0)
Monocytes Relative: 6 %
NEUTROS ABS: 5.1 10*3/uL (ref 1.7–7.7)
NEUTROS PCT: 60 %
Platelets: 257 10*3/uL (ref 150–400)
RBC: 4.73 MIL/uL (ref 4.22–5.81)
RDW: 13.4 % (ref 11.5–15.5)
WBC: 8.4 10*3/uL (ref 4.0–10.5)

## 2015-12-24 LAB — BASIC METABOLIC PANEL
Anion gap: 7 (ref 5–15)
BUN: 12 mg/dL (ref 6–20)
CALCIUM: 9.1 mg/dL (ref 8.9–10.3)
CO2: 29 mmol/L (ref 22–32)
Chloride: 107 mmol/L (ref 101–111)
Creatinine, Ser: 0.93 mg/dL (ref 0.61–1.24)
Glucose, Bld: 82 mg/dL (ref 65–99)
Potassium: 4.1 mmol/L (ref 3.5–5.1)
Sodium: 143 mmol/L (ref 135–145)

## 2015-12-24 MED ORDER — MECLIZINE HCL 12.5 MG PO TABS
25.0000 mg | ORAL_TABLET | Freq: Once | ORAL | Status: AC
  Administered 2015-12-24: 25 mg via ORAL
  Filled 2015-12-24: qty 2

## 2015-12-24 MED ORDER — ONDANSETRON HCL 4 MG PO TABS: 4.0000 mg | ORAL_TABLET | Freq: Three times a day (TID) | ORAL | Status: DC | PRN

## 2015-12-24 MED ORDER — ONDANSETRON 8 MG PO TBDP
8.0000 mg | ORAL_TABLET | Freq: Once | ORAL | Status: AC
  Administered 2015-12-24: 8 mg via ORAL
  Filled 2015-12-24: qty 1

## 2015-12-24 MED ORDER — MECLIZINE HCL 25 MG PO TABS: 25.0000 mg | ORAL_TABLET | Freq: Three times a day (TID) | ORAL | Status: DC | PRN

## 2015-12-24 NOTE — ED Notes (Signed)
Pt complain of dizziness since yesterday

## 2015-12-24 NOTE — Discharge Instructions (Signed)
°Emergency Department Resource Guide °1) Find a Doctor and Pay Out of Pocket °Although you won't have to find out who is covered by your insurance plan, it is a good idea to ask around and get recommendations. You will then need to call the office and see if the doctor you have chosen will accept you as a new patient and what types of options they offer for patients who are self-pay. Some doctors offer discounts or will set up payment plans for their patients who do not have insurance, but you will need to ask so you aren't surprised when you get to your appointment. ° °2) Contact Your Local Health Department °Not all health departments have doctors that can see patients for sick visits, but many do, so it is worth a call to see if yours does. If you don't know where your local health department is, you can check in your phone book. The CDC also has a tool to help you locate your state's health department, and many state websites also have listings of all of their local health departments. ° °3) Find a Walk-in Clinic °If your illness is not likely to be very severe or complicated, you may want to try a walk in clinic. These are popping up all over the country in pharmacies, drugstores, and shopping centers. They're usually staffed by nurse practitioners or physician assistants that have been trained to treat common illnesses and complaints. They're usually fairly quick and inexpensive. However, if you have serious medical issues or chronic medical problems, these are probably not your Jamar option. ° °No Primary Care Doctor: °- Call [facility] at  832-8000 - they can help you locate a primary care doctor that  accepts your insurance, provides certain services, etc. °- Physician Referral Service- 1-800-533-3463 ° °Chronic Pain Problems: °Organization         Address  Phone   Notes  °[facility]  (336) 297-2271 Patients need to be referred by their primary care doctor.  ° °Medication  Assistance: °Organization         Address  Phone   Notes  °[place] [facility] [address] °[place], [place] [address] (336) 641-8030 --Must be a resident of [place] °-- Must have NO insurance coverage whatsoever (no Medicaid/ Medicare, etc.) °-- The pt. MUST have a primary care doctor that directs their care regularly and follows them in the community °  °[facility]  (866) 331-1348   °[employer]  (888) 892-1162   ° °Agencies that provide inexpensive medical care: °Organization         Address  Phone   Notes  °[facility]  (336) 832-8035   °[facility]    (336) 832-7272   °[facility] [address] °[place], [place] [address] (336) 832-4777   °[facility] [address], °[place] (336) 271-4999   °[facility]    (336) 373-0678   °[facility]    (336) 272-1050   °[facility] ° [address], [place] Phone:  (336) 832-4444, Fax:  (336) 832-4440 Hours of Operation:  9 am - 6 pm, M-F.  Also accepts Medicaid/Medicare and self-pay.  °[facility] ° [address], [place] Phone: (336) [phone], Fax: (336) [phone]. Hours of Operation:  8:30 am - 5:30 pm, M-F.  Also accepts Medicaid and self-pay.  °[facility] [place] [address], [place] Phone: (336) 878-6027   °[facility] [address], [place], [place] (336)723-1848, Ext. 123 Mondays & Thursdays: 7-9 AM.  First 15 patients are seen on a first come, first serve basis. °  ° °Medicaid-accepting [place] Providers: ° °Organization         Address  Phone   Notes  °[facility] [address], [place] (336) 641-2100 Also accepts self-pay patients.  °[facility] [address], [place] ° (336) 856-9996   °[facility] [address], [place]  (336) 288-8857   °[facility] [address] [place] [address], [place] (336) 299-7000   °Veita Bland [address], [place]  ° (336) 373-1557 Only accepts [place] Access Medicaid patients after they have their name applied to their card.  ° °Self-Pay (no insurance) in [place]: ° °Organization         Address  Phone   Notes  °Sickle Cell Patients, [facility] [address], [place] (336) 832-1970   °[facility] [address], [place] (336) 832-4400   °[facility] ° [address], [place] (336) 992-4800   °[facility]/Dr. Osei-Bonsu ° [address] [place] [address], [place] or [address], [place] (336) [phone] Phone number for both [place] and [place] locations is the same.  °[facility] [address], [place] (336) 299-0000   °[facility] [address] [place] [address], [place] or [address] (336) 852-7530 °(336) 878-2260   °[facility] [address], [place] (336) 350-1642, phone; (336) 294-5005, fax Sees patients 1st and 3rd Saturday of every month.  Must not qualify for public or private insurance (i.e. Medicaid, Medicare, [place] Health Choice, Veterans' Benefits) • Household income should be no more than 200% of the poverty level •The clinic cannot treat you if you are pregnant or think you are pregnant • Sexually transmitted diseases are not treated at the clinic.  ° ° °Dental Care: °Organization         Address  Phone  Notes  °[place] [facility] [address], [place] (336) 641-6152 Accepts children up to age 21 who are enrolled in Medicaid or [place] Health Choice; pregnant women with a Medicaid card; and children who have applied for Medicaid or [place] Health Choice, but were declined, whose parents can pay a reduced fee at time of service.  °[place]  [facility] [place]  [address], [place] (336) 641-7733 Accepts children up to age 21 who are enrolled in Medicaid or [place] Health Choice; pregnant women with a Medicaid card; and children who have applied for Medicaid or [place] Health Choice, but were declined, whose parents can pay a reduced fee at time of service.  °[facility] ° [address], [place] (336) 641-4533 Patients are seen by appointment only. Walk-ins are not accepted. [facility] will see patients 18 years of age and older. °Monday - Tuesday (8am-5pm) °Most Wednesdays (8:30-5pm) °$30 per visit, cash only  °[facility] ° [address], [place] (336) 641-4533 Patients are seen by appointment only. Walk-ins are not accepted. [facility] will see patients 18 years of age and older. °One   Wednesday Evening (Monthly: Volunteer Based).  $30 per visit, cash only  °[facility]  (919) 537-3737 for adults; Children under age 4, call Graduate Pediatric Dentistry at ([phone]) [phone]. Children aged 4-14, please call ([phone]) [phone] to request a pediatric application. ° Dental services are provided in all areas of dental care including fillings, crowns and bridges, complete and partial dentures, implants, gum treatment, root canals, and extractions. Preventive care is also provided. Treatment is provided to both adults and children. °Patients are selected via a lottery and there is often a waiting list. °  °[facility] [address], °[place] ° (336) 763-8833 [email or web address].[email or web address] °  °[facility] [address], [place], [place] (336)723-1848, Ext. 123 Second and Fourth Thursday of each month, opens at 6:30 AM; Clinic ends at 9 AM.  Patients are seen on a first-come first-served basis, and a limited number are seen during each clinic.  ° °[facility] ° [address], [place], [place] (336) [phone]    Eligibility Requirements °You must have lived in [place], [place], or [place] counties for at least the last three months. °  You cannot be eligible for state or federal sponsored healthcare insurance, including Veterans Administration, Medicaid, or Medicare. °  You generally cannot be eligible for healthcare insurance through your employer.  °  How to apply: °Eligibility screenings are held every Tuesday and Wednesday afternoon from 1:00 pm until 4:00 pm. You do not need an appointment for the interview!  °[facility] [address], [place], [place] 336-631-2330   °[facility]  336-342-8273   °[facility]  336-703-3100   °[facility]  336-570-6415   ° °Behavioral Health Resources in the Community: °Intensive Outpatient Programs °Organization         Address  Phone  Notes  °[facility] [address], [place], [place] 336-878-6098   °[facility] [address], [place], [place] 336-832-9800   °[facility] [address], [place], [place] ° 336-882-2125   °[facility] [address],  °[place], [place] 1-800-853-5163 or 336-641-4981   °Substance Abuse Resources °Organization         Address  Phone  Notes  °[facility]  336-882-2125   °[facility]  336-784-9470   °[facility]  336-285-9073   °[facility]  336-845-3988   °Residential & Outpatient Substance Abuse Program  1-800-659-3381   °Psychological Services °Organization         Address  Phone  Notes  °[facility]  336- 832-9600   °[facility]  336- 378-7881   °[facility] [address], [place] 1-800-853-5163 or 336-641-4981   ° °Mobile Crisis Teams °Organization         Address  Phone  Notes  °[facility], Mobile Crisis Care Unit  1-877-626-1772   °Assertive °[facility] ° [address]  [place], [place] 336-834-9664   °Sharon DeEsch [address] °[place] 336-554-5454   ° °Self-Help/Support Groups °Organization         Address  Phone             Notes  °[facility]  - variety of support groups  336- 373-1402 Call for more information  °Narcotics Anonymous (NA), [facility] [address], °[place]  2 meetings at this location  ° °  Residential Treatment Programs Organization         Address  Phone  Notes  [facility] [address],    [place]  1-610-960-4540   [facility]  [address] [place] [address], [place], [place] [phone]   [facility] [address] [place], [place] [phone] Admissions: 8am-3pm M-F  [facility] [address],    [place], [place] 295-284-1324   [facility] [address] [place], [place], [place] 401-027-2536   [facility] [address],  [place], [place] 644-034-7425   [facility] Intensive Outpatient [address], [place], [place] 956-387-5643   [facility] [address] [place]  [place], [place] 3-295-188-4166 or 301-437-3296   [facility] [address], [place], [place] 323-557-3220 Accepts Medicaid  [facility] [address],  [place] 2-542-706-2376 Substance Abuse/Addiction Treatment   [facility]         Address  Phone  Notes  [facility]  (681) 632-4779   Angie Fava, PhD [address] [place], [place]   (774)851-1293 or (732) 719-7952   [facility]   [address] [place], [place] 567-829-4346   [facility] [address] [place], [place] [phone] Insurance/Medicaid/sponsorship through [facility] and Families [address]                                    [place], [place] [phone] Therapy/tele-psych/case    [facility] [address], [place] (716) [phone]    Dr. Lolly Mustache  [phone]   [facility]  [employer] [facility] Dept. 1) [address] [place] 2) [address] [place] 3)  [address], [place] [phone]  [phone]   [facility] Child Abuse Hotline 8385717800 or 423-120-1380 (After Hours)      Take the prescriptions as directed.  Call your regular medical doctor and the ENT doctor to schedule a follow up appointment within the next week.  Return to the Emergency Department immediately sooner if worsening.

## 2015-12-24 NOTE — ED Notes (Signed)
Pt ambulated fine walked ok no dizziness.

## 2015-12-24 NOTE — ED Provider Notes (Signed)
CSN: [id number]     Arrival date & time 12/24/15  1311 History   First MD Initiated Contact with Patient 12/24/15 1337     Chief Complaint  Patient presents with  . Dizziness      HPI Pt was seen at 1340. Per pt, c/o sudden onset and resolution of several intermittent episodes of "dizziness" since yesterday. Pt describes the dizziness as "everything is moving around," worsens with turning head or eyes side-to-side. Has been associated with nausea. Denies CP/palpitations, no SOB/cough, no abd pain, no vomiting/diarrhea, no visual changes, no focal motor weakness, no tingling/numbness in extremities, no ataxia, no slurred speech, no facial droop.     Past Medical History  Diagnosis Date  . Bronchitis   . GERD (gastroesophageal reflux disease)   . Graves disease   . Depression   . Anxiety   . IBS (irritable bowel syndrome)   . Chronic back pain    Past Surgical History  Procedure Laterality Date  . Appendectomy    . Colonoscopy N/A 04/08/2014    Procedure: COLONOSCOPY;  Surgeon: Malissa Hippo, MD;  [facility];  Service: Endoscopy;  Laterality: N/A;  135  . Esophagogastroduodenoscopy N/A 04/08/2014    Procedure: ESOPHAGOGASTRODUODENOSCOPY (EGD);  Surgeon: Malissa Hippo, MD;  [facility];  Service: Endoscopy;  Laterality: N/A;    Social History  Substance Use Topics  . Smoking status: Current Every Day Smoker -- 1.00 packs/day for 35 years    Types: Cigarettes  . Smokeless tobacco: Never Used     Comment: 1 pack a day  . Alcohol Use: 7.2 oz/week    12 Cans of beer per week     Comment: daily x 12 beers a day usually.    Review of Systems ROS: Statement: All systems negative except as marked or noted in the HPI; Constitutional: Negative for fever and chills. ; ; Eyes: Negative for eye pain, redness and discharge. ; ; ENMT: Negative for ear pain, hoarseness, nasal congestion, sinus pressure and sore throat. ; ; Cardiovascular: Negative for chest pain,  palpitations, diaphoresis, dyspnea and peripheral edema. ; ; Respiratory: Negative for cough, wheezing and stridor. ; ; Gastrointestinal: +nausea. Negative for vomiting, diarrhea, abdominal pain, blood in stool, hematemesis, jaundice and rectal bleeding. . ; ; Genitourinary: Negative for dysuria, flank pain and hematuria. ; ; Musculoskeletal: Negative for back pain and neck pain. Negative for swelling and trauma.; ; Skin: Negative for pruritus, rash, abrasions, blisters, bruising and skin lesion.; ; Neuro: +"dizziness." Negative for headache, lightheadedness and neck stiffness. Negative for weakness, altered level of consciousness , altered mental status, extremity weakness, paresthesias, involuntary movement, seizure and syncope.     Allergies  Penicillins  Home Medications   Prior to Admission medications   Medication Sig Start Date End Date Taking? Authorizing Provider  ALPRAZolam Prudy Feeler) 1 MG tablet Take 1-2 mg by mouth at bedtime.    Yes Historical Provider, MD  dicyclomine (BENTYL) 10 MG capsule TAKE 1 CAPSULE (10 MG TOTAL)  BY MOUTH 3 (THREE) TIMES DAILY BEFORE MEA LS.   Yes Malissa Hippo, MD  HYDROcodone-acetaminophen (NORCO/VICODIN) 5-325 MG per tablet Take 1 tablet by mouth 3 (three) times daily as needed for moderate pain.    Yes Historical Provider, MD  mupirocin ointment (BACTROBAN) 2 % Place 2 application into the nose 2 (two) times daily. 12/06/15 12/05/16 Yes Historical Provider, MD  pantoprazole (PROTONIX) 40 MG tablet TAKE 1 TABLET (40 MG TOTAL) BY MOUTH 2 (TWO)  TIMES DAILY   BEFORE A MEA L . 09/23/15  Yes Najeeb U Rehman, MD   BP 130/78 mmHg  Pulse 78  Temp(Src) 98.8 F (37.1 C) (Oral)  Resp 14  Ht  (1.727 m)  Wt 116 lb (52.617 kg)  BMI 17.64 kg/m2  SpO2 100% Physical Exam  1345: Physical examination:  Nursing notes reviewed; Vital signs and O2 SAT reviewed;  Constitutional: Well developed, Well nourished, Well hydrated, In no acute distress; Head:  Normocephalic,  atraumatic; Eyes: EOMI, PERRL, No scleral icterus; ENMT: TM's clear bilat. +edemetous nasal turbinates bilat with clear rhinorrhea. Mouth and pharynx normal, Mucous membranes moist; Neck: Supple, Full range of motion, No lymphadenopathy; Cardiovascular: Regular rate and rhythm, No gallop; Respiratory: Breath sounds clear & equal bilaterally, No rales, rhonchi, wheezes.  Speaking full sentences with ease, Normal respiratory effort/excursion; Chest: Nontender, Movement normal; Abdomen: Soft, Nontender, Nondistended, Normal bowel sounds; Genitourinary: No CVA tenderness; Extremities: Pulses normal, No tenderness, No edema, No calf edema or asymmetry.; Neuro: Major CN grossly intact. Speech clear.  No facial droop. +right horizontal end gaze fatigable nystagmus which reproduces pt's symptoms. Grips equal. Strength 5/5 equal bilat UE's and LE's.  DTR 2/4 equal bilat UE's and LE's.  No gross sensory deficits.  Normal cerebellar testing bilat UE's (finger-nose) and LE's (heel-shin)..; Skin: Color normal, Warm, Dry.   ED Course  Procedures (including critical care time) Labs Review  Imaging Review  I have personally reviewed and evaluated these images and lab results as part of my medical decision-making.   EKG Interpretation None      MDM  MDM Reviewed: previous chart, nursing note and vitals Reviewed previous: labs Interpretation: labs and CT scan      Results for orders placed or performed during the hospital encounter of 12/24/15  Basic metabolic panel  Result Value Ref Range   Sodium 143 135 - 145 mmol/L   Potassium 4.1 3.5 - 5.1 mmol/L   Chloride 107 101 - 111 mmol/L   CO2 29 22 - 32 mmol/L   Glucose, Bld 82 65 - 99 mg/dL   BUN 12 6 - 20 mg/dL   Creatinine, Ser 1.61 0.61 - 1.24 mg/dL   Calcium 9.1 8.9 - 09.6 mg/dL   GFR calc non Af Amer >60 >60 mL/min   GFR calc Af Amer >60 >60 mL/min   Anion gap 7 5 - 15  CBC with Differential  Result Value Ref Range   WBC 8.4 4.0 - 10.5 K/uL    RBC 4.73 4.22 - 5.81 MIL/uL   Hemoglobin 13.9 13.0 - 17.0 g/dL   HCT 04.5 40.9 - 81.1 %   MCV 87.1 78.0 - 100.0 fL   MCH 29.4 26.0 - 34.0 pg   MCHC 33.7 30.0 - 36.0 g/dL   RDW 91.4 78.2 - 95.6 %   Platelets 257 150 - 400 K/uL   Neutrophils Relative % 60 %   Neutro Abs 5.1 1.7 - 7.7 K/uL   Lymphocytes Relative 30 %   Lymphs Abs 2.5 0.7 - 4.0 K/uL   Monocytes Relative 6 %   Monocytes Absolute 0.5 0.1 - 1.0 K/uL   Eosinophils Relative 3 %   Eosinophils Absolute 0.2 0.0 - 0.7 K/uL   Basophils Relative 1 %   Basophils Absolute 0.0 0.0 - 0.1 K/uL   Ct Head Wo Contrast 12/24/2015  CLINICAL DATA:  Dizziness and nausea last night and again this morning. No known injury. Initial encounter. EXAM: CT HEAD WITHOUT CONTRAST TECHNIQUE: Contiguous  axial images were obtained from the base of the skull through the vertex without intravenous contrast. COMPARISON:  None. FINDINGS: The brain appears normal without hemorrhage, infarct, mass lesion, mass effect, midline shift or abnormal extra-axial fluid collection. No hydrocephalus or pneumocephalus. The calvarium is intact. Imaged paranasal sinuses and mastoid air cells are clear. IMPRESSION: Negative head CT. Electronically Signed   By: Drusilla Kanner M.D.   On: 12/24/2015 15:11    1700:  Pt states he feels better after meds and wants to go home now. Pt has ambulated with steady gait, easy resps, NAD; denies complaints. Pt has tol PO well without N/V. VS remain stable. Neuro exam remains intact. Tx peripheral vertigo symptomatically at this time. Dx and testing d/w pt.  Questions answered.  Verb understanding, agreeable to d/c home with outpt f/u.   Samuel Jester, DO 12/27/15 2112

## 2016-06-07 ENCOUNTER — Encounter (INDEPENDENT_AMBULATORY_CARE_PROVIDER_SITE_OTHER): Payer: Self-pay | Admitting: Internal Medicine

## 2016-09-05 ENCOUNTER — Ambulatory Visit (INDEPENDENT_AMBULATORY_CARE_PROVIDER_SITE_OTHER): Payer: BLUE CROSS/BLUE SHIELD | Admitting: Internal Medicine

## 2016-09-05 ENCOUNTER — Encounter (INDEPENDENT_AMBULATORY_CARE_PROVIDER_SITE_OTHER): Payer: Self-pay | Admitting: Internal Medicine

## 2016-09-05 VITALS — BP 94/60 | HR 65 | Temp 98.4°F | Ht 68.0 in | Wt 117.4 lb

## 2016-09-05 DIAGNOSIS — K219 Gastro-esophageal reflux disease without esophagitis: Secondary | ICD-10-CM | POA: Diagnosis not present

## 2016-09-05 NOTE — Patient Instructions (Signed)
OV in 1 year.  

## 2016-09-05 NOTE — Progress Notes (Signed)
Subjective:    Patient ID: Jason Lamb, male    DOB: 08/03/1961, 55 y.o.   MRN: [id number]  HPIHere today for f/u. Last seen in October of 2016. Hx of GERD. He tells me is controlled with Protonix. He says he has not drank etoh in 2 years. Hx of H pylori with treatment. Appetite is good. No weight loss. He has a BM x 1 a day.   No diarrhea.  No abdominal pain.  He is working full time.  PCP Dr Lysbeth GalasNyland   CBC    Component Value Date/Time   WBC 8.4 12/24/2015 1359   RBC 4.73 12/24/2015 1359   HGB 13.9 12/24/2015 1359   HCT 41.2 12/24/2015 1359   PLT 257 12/24/2015 1359   MCV 87.1 12/24/2015 1359   MCH 29.4 12/24/2015 1359   MCHC 33.7 12/24/2015 1359   RDW 13.4 12/24/2015 1359   LYMPHSABS 2.5 12/24/2015 1359   MONOABS 0.5 12/24/2015 1359   EOSABS 0.2 12/24/2015 1359   BASOSABS 0.0 12/24/2015 1359       Review of Systems Past Medical History:  Diagnosis Date  . Anxiety   . Bronchitis   . Chronic back pain   . Depression   . GERD (gastroesophageal reflux disease)   . Graves disease   . IBS (irritable bowel syndrome)     Past Surgical History:  Procedure Laterality Date  . APPENDECTOMY    . COLONOSCOPY N/A 04/08/2014   Procedure: COLONOSCOPY;  Surgeon: Malissa HippoNajeeb U Rehman, MD;  [facility];  Service: Endoscopy;  Laterality: N/A;  135  . ESOPHAGOGASTRODUODENOSCOPY N/A 04/08/2014   Procedure: ESOPHAGOGASTRODUODENOSCOPY (EGD);  Surgeon: Malissa HippoNajeeb U Rehman, MD;  [facility];  Service: Endoscopy;  Laterality: N/A;    Allergies  Allergen Reactions  . Penicillins Other (See Comments)    Childhood allergy    Current Outpatient Prescriptions on File Prior to Visit  Medication Sig Dispense Refill  . ALPRAZolam (XANAX) 1 MG tablet Take 1-2 mg by mouth at bedtime.     Mar[place] Kitchen. HYDROcodone-acetaminophen (NORCO/VICODIN) 5-325 MG per tablet Take 1 tablet by mouth 3 (three) times daily as needed for moderate pain.     . pantoprazole (PROTONIX) 40 MG tablet TAKE 1 TABLET  (40 MG TOTAL) BY MOUTH 2 (TWO) TIMES DAILY   BEFORE A MEA L . 60 tablet 5  . dicyclomine (BENTYL) 10 MG capsule TAKE 1 CAPSULE (10 MG TOTAL)  BY MOUTH 3 (THREE) TIMES DAILY BEFORE MEA LS. 90 capsule 5  . meclizine (ANTIVERT) 25 MG tablet Take 1 tablet (25 mg total) by mouth 3 (three) times daily as needed for dizziness. (Patient not taking: Reported on 09/05/2016) 15 tablet 0  . mupirocin ointment (BACTROBAN) 2 % Place 2 application into the nose 2 (two) times daily.    . ondansetron (ZOFRAN) 4 MG tablet Take 1 tablet (4 mg total) by mouth every 8 (eight) hours as needed for nausea or vomiting. (Patient not taking: Reported on 09/05/2016) 6 tablet 0   No current facility-administered medications on file prior to visit.        Objective:   Physical Exam Blood pressure 94/60, pulse 65, temperature 98.4 F (36.9 C), height 5\' 8"  (1.727 m), weight 117 lb 6.4 oz (53.3 kg).  Alert and oriented. Skin warm and dry. Oral mucosa is moist.   . Sclera anicteric, conjunctivae is pink. Thyroid not enlarged. No cervical lymphadenopathy. Lungs clear. Heart regular rate and rhythm.  Abdomen is soft. Bowel  sounds are positive. No hepatomegaly. No abdominal masses felt. No tenderness.  No edema to lower extremities.        Assessment & Plan:  GERD controlled with Protonix.  He is doing well.  OV in 1 year.

## 2017-01-22 ENCOUNTER — Other Ambulatory Visit (HOSPITAL_COMMUNITY): Payer: Self-pay | Admitting: Adult Health Nurse Practitioner

## 2017-01-22 DIAGNOSIS — N6002 Solitary cyst of left breast: Secondary | ICD-10-CM

## 2017-01-30 ENCOUNTER — Ambulatory Visit (HOSPITAL_COMMUNITY)
Admission: RE | Admit: 2017-01-30 | Discharge: 2017-01-30 | Disposition: A | Discharge status: Home / Self Care | Payer: BLUE CROSS/BLUE SHIELD | Source: Ambulatory Visit | Attending: Adult Health Nurse Practitioner | Admitting: Adult Health Nurse Practitioner

## 2017-01-30 DIAGNOSIS — N62 Hypertrophy of breast: Secondary | ICD-10-CM | POA: Diagnosis not present

## 2017-01-30 DIAGNOSIS — N6002 Solitary cyst of left breast: Secondary | ICD-10-CM

## 2017-07-26 ENCOUNTER — Encounter (INDEPENDENT_AMBULATORY_CARE_PROVIDER_SITE_OTHER): Payer: Self-pay

## 2017-07-26 ENCOUNTER — Encounter (INDEPENDENT_AMBULATORY_CARE_PROVIDER_SITE_OTHER): Payer: Self-pay | Admitting: Internal Medicine

## 2017-08-03 ENCOUNTER — Encounter: Payer: Self-pay | Admitting: Family Medicine

## 2017-08-03 ENCOUNTER — Ambulatory Visit (INDEPENDENT_AMBULATORY_CARE_PROVIDER_SITE_OTHER): Payer: Worker's Compensation

## 2017-08-03 ENCOUNTER — Ambulatory Visit (INDEPENDENT_AMBULATORY_CARE_PROVIDER_SITE_OTHER): Payer: Worker's Compensation | Admitting: Family Medicine

## 2017-08-03 VITALS — BP 111/87 | HR 87 | Temp 97.8°F | Ht 68.0 in | Wt 132.0 lb

## 2017-08-03 DIAGNOSIS — M542 Cervicalgia: Secondary | ICD-10-CM

## 2017-08-03 DIAGNOSIS — M25512 Pain in left shoulder: Secondary | ICD-10-CM | POA: Diagnosis not present

## 2017-08-03 DIAGNOSIS — Y990 Civilian activity done for income or pay: Secondary | ICD-10-CM

## 2017-08-03 MED ORDER — DICLOFENAC SODIUM 75 MG PO TBEC: 75.0000 mg | DELAYED_RELEASE_TABLET | Freq: Two times a day (BID) | ORAL | 0 refills | Status: DC
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
## 2017-08-06 NOTE — Progress Notes (Signed)
Subjective: CC: Work injury Date of injury: 08/03/2017 Employer: [employer] PCP: Jason SellerKatherine Hemberg, NP/ Jason CatchingLeonard Nyland, MD AVW:UJWJXHPI:Jason Lamb is a 56 y.o. male presenting to clinic today for:  1. Follow up Neck and shoulder pain Patient seen last week for neck and shoulder pain sustained after an accident at work. At that appointment, he was having pain with active range of motion in the left shoulder and into the muscles of his neck. He had x-rays of the left shoulder and of the C-spine. Left shoulder was unremarkable. His C-spine was remarkable for degenerative changes but no acute fractures or processes. He was prescribed diclofenac and instructed to continue his home Flexeril. Patient reports that pain has been worse since last visit. He reports compliance with prescribed medications. He also has been applying ice and heat to affected areas. He's been "rolling" his neck in efforts to improve pain. He does endorse occasional numbness and tingling in the left upper extremity. He reports that he actually had problems with this prior to injury but it seems worse now. Denies weakness.  Allergies  Allergen Reactions  . Penicillins Other (See Comments)    Childhood allergy   Past Medical History:  Diagnosis Date  . Anxiety   . Bronchitis   . Chronic back pain   . Depression   . GERD (gastroesophageal reflux disease)   . Graves disease   . IBS (irritable bowel syndrome)    Past Surgical History:  Procedure Laterality Date  . APPENDECTOMY    . COLONOSCOPY N/A 04/08/2014   Procedure: COLONOSCOPY;  Surgeon: Jason HippoNajeeb U Rehman, MD;  [facility];  Service: Endoscopy;  Laterality: N/A;  135  . ESOPHAGOGASTRODUODENOSCOPY N/A 04/08/2014   Procedure: ESOPHAGOGASTRODUODENOSCOPY (EGD);  Surgeon: Jason HippoNajeeb U Rehman, MD;  [facility];  Service: Endoscopy;  Laterality: N/A;   No family history on file. Social Hx: everyday smoker.Current medications reviewed.    ROS:  Per HPI  Objective: Office vital signs reviewed. BP 115/74   Pulse 89   Temp 97.9 F (36.6 C) (Oral)   Wt 136 lb (61.7 kg)   BMI 20.68 kg/m   Physical Examination:  General: Awake, alert, thin male, appears older than stated age, No acute distress HEENT: [place]/AT    Neck: No masses palpated. No lymphadenopathy     Eyes: PERRLA, extraocular membranes intact C-Spine: Full active range of motion of the C-spine in flexion and extension.  +pain with range of motion testing in flexion and extension. 45 loss in rotation of the C-spine to the right. 10 loss of rotation of C-spine to the left. No midline tenderness to palpation. + paraspinal tenderness to palpation bilaterally C5 - T1 level. No palpable bony abnormalities. Negative Spurling's Extremities: warm, well perfused, No edema, cyanosis or clubbing; +2 pulses bilaterally MSK: normal gait and normal station  Left shoulder: Full AROM. Positive empty can sign. Tenderness to left trapezius. He has 5/ 5 upper extremity strength. Light touch sensation intact. Neuro: no focal deficits  No results found.  Assessment/ Plan: 56 y.o. male   1. Neck pain Worsening per patient. His active range of motion is also worse compared to previous exam, with about a 45 loss in rotation to the left and 10 loss of rotation of the C-spine to the right. Flexion and extension are preserved. He has continued paraspinal tenderness to palpation bilaterally particularly at the C5-T1 levels. No focal neurologic deficits. No palpable bony abnormalities. I provided him a copy of both  his shoulder and his C-spine x-ray from previous exam. While I think this is likely continued muscle spasm, he does have baseline chronic degenerative changes that have likely been aggravated by recent accident. At this point he is fairly refractory to oral NSAID, muscle relaxer, and Norco that being prescribed by his primary. I have placed a referral to orthopedics for further evaluation  and management. I have excused him for the remainder of the day, along him to rest through the weekend. Work restrictions include no lifting, pushing, pulling over 10 pounds; avoid prolonged standing/walking, as patient notes this aggravates his symptoms. Further work restrictions, time off per orthopedics. This was discussed with patient who voiced good understanding.  2. Acute pain of left shoulder Seemingly improving. However, he continues to have trapezius spasm on the left side. No focal deficits on shoulder exam today. Work restrictions and referral as above.  Jason Ip, DO [facility] [phone]

## 2017-08-07 ENCOUNTER — Telehealth: Payer: Self-pay | Admitting: Family Medicine

## 2017-08-07 NOTE — Telephone Encounter (Signed)
Patient aware of xray results.   

## 2017-08-10 ENCOUNTER — Encounter: Payer: Self-pay | Admitting: Family Medicine

## 2017-08-10 ENCOUNTER — Ambulatory Visit (INDEPENDENT_AMBULATORY_CARE_PROVIDER_SITE_OTHER): Payer: Worker's Compensation | Admitting: Family Medicine

## 2017-08-10 VITALS — BP 115/74 | HR 89 | Temp 97.9°F | Ht 68.0 in | Wt 136.0 lb

## 2017-08-10 DIAGNOSIS — M25512 Pain in left shoulder: Secondary | ICD-10-CM | POA: Diagnosis not present

## 2017-08-10 DIAGNOSIS — M542 Cervicalgia: Secondary | ICD-10-CM | POA: Diagnosis not present

## 2017-08-10 NOTE — Patient Instructions (Signed)
You were given a copy of your shoulder and neck x-ray from our last visit. As we discussed previously, there were no acute findings on imaging study but there was evidence of chronic degenerative changes. I recommend that you continue to follow-up with your provider for your chronic pain.

## 2017-08-27 ENCOUNTER — Ambulatory Visit (INDEPENDENT_AMBULATORY_CARE_PROVIDER_SITE_OTHER): Payer: Worker's Compensation | Admitting: Orthopedic Surgery

## 2017-08-27 ENCOUNTER — Encounter (INDEPENDENT_AMBULATORY_CARE_PROVIDER_SITE_OTHER): Payer: Self-pay | Admitting: Orthopedic Surgery

## 2017-08-27 DIAGNOSIS — M5412 Radiculopathy, cervical region: Secondary | ICD-10-CM | POA: Diagnosis not present

## 2017-08-28 ENCOUNTER — Telehealth (INDEPENDENT_AMBULATORY_CARE_PROVIDER_SITE_OTHER): Payer: Self-pay | Admitting: Orthopedic Surgery

## 2017-08-28 NOTE — Telephone Encounter (Signed)
Should not lift more than 10 lbs until scan

## 2017-08-28 NOTE — Telephone Encounter (Signed)
ic pt and advised

## 2017-08-28 NOTE — Telephone Encounter (Signed)
Please advise on any limitations that you feel patient has.Thanks.

## 2017-08-28 NOTE — Telephone Encounter (Signed)
Patient called asking about his limitations? If you could give him a call and speak with him about this. CB # [phone]

## 2017-08-28 NOTE — Telephone Encounter (Signed)
Patient also stated he had scheduled his MRI scan. I made him appt to come in and review results. Patient stated it will be done at [facility]. I advised patient would need to make sure to bring disc with images as well as copy of the imaging report.
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
## 2017-09-05 ENCOUNTER — Encounter (INDEPENDENT_AMBULATORY_CARE_PROVIDER_SITE_OTHER): Payer: Self-pay | Admitting: Orthopedic Surgery

## 2017-09-05 ENCOUNTER — Ambulatory Visit (INDEPENDENT_AMBULATORY_CARE_PROVIDER_SITE_OTHER): Payer: Worker's Compensation | Admitting: Orthopedic Surgery

## 2017-09-05 ENCOUNTER — Ambulatory Visit (INDEPENDENT_AMBULATORY_CARE_PROVIDER_SITE_OTHER): Payer: BLUE CROSS/BLUE SHIELD | Admitting: Internal Medicine

## 2017-09-05 DIAGNOSIS — M5412 Radiculopathy, cervical region: Secondary | ICD-10-CM | POA: Diagnosis not present

## 2017-09-05 DIAGNOSIS — M542 Cervicalgia: Secondary | ICD-10-CM | POA: Diagnosis not present

## 2017-09-10 ENCOUNTER — Ambulatory Visit (INDEPENDENT_AMBULATORY_CARE_PROVIDER_SITE_OTHER): Payer: BLUE CROSS/BLUE SHIELD | Admitting: Internal Medicine

## 2017-09-10 ENCOUNTER — Encounter (INDEPENDENT_AMBULATORY_CARE_PROVIDER_SITE_OTHER): Payer: Self-pay | Admitting: Internal Medicine

## 2017-09-10 VITALS — BP 122/74 | HR 84 | Temp 98.1°F | Ht 68.0 in | Wt 131.5 lb

## 2017-09-10 DIAGNOSIS — K219 Gastro-esophageal reflux disease without esophagitis: Secondary | ICD-10-CM

## 2017-09-10 NOTE — Progress Notes (Signed)
   Subjective:    Patient ID: Jason Lamb, male    DOB: [birth year], 56 y.o.   MRN: [id number]  HPI Here today for f/u.   Hx of chronic GERD. Hx of H. Pylori in the pat. Appetite is good. No weight loss. He has actually gained about 14 pounds.   He says he occasionally has acid reflux and take a OTC acid reflux pill every 2 weeks as needed  Usually has a BM daily. Continues to work full time.  Is not drink etoh.  Avoid spicy foods. Tries to eat healthy.    PCP Dr. Lysbeth Galas.     Review of Systems Past Medical History:  Diagnosis Date  . Anxiety   . Bronchitis   . Chronic back pain   . Depression   . GERD (gastroesophageal reflux disease)   . Graves disease   . IBS (irritable bowel syndrome)     Past Surgical History:  Procedure Laterality Date  . APPENDECTOMY    . COLONOSCOPY N/A 04/08/2014   Procedure: COLONOSCOPY;  Surgeon: Malissa Hippo, MD;  [facility];  Service: Endoscopy;  Laterality: N/A;  135  . ESOPHAGOGASTRODUODENOSCOPY N/A 04/08/2014   Procedure: ESOPHAGOGASTRODUODENOSCOPY (EGD);  Surgeon: Malissa Hippo, MD;  [facility]: [facility];  Service: Endoscopy;  Laterality: N/A;    Allergies  Allergen Reactions  . Penicillins Other (See Comments)    Childhood allergy    Current Outpatient Prescriptions on File Prior to Visit  Medication Sig Dispense Refill  . ALPRAZolam (XANAX) 1 MG tablet Take 1-2 mg by mouth at bedtime.     . cyclobenzaprine (FLEXERIL) 10 MG tablet Take by mouth.    . dicyclomine (BENTYL) 10 MG capsule TAKE 1 CAPSULE (10 MG TOTAL)  BY MOUTH 3 (THREE) TIMES DAILY BEFORE MEA LS. 90 capsule 5  . escitalopram (LEXAPRO) 10 MG tablet Take 1 tab PO once daily    . HYDROcodone-acetaminophen (NORCO/VICODIN) 5-325 MG per tablet Take 1 tablet by mouth 3 (three) times daily as needed for moderate pain.     . Vitamin D, Ergocalciferol, (DRISDOL) 50000 units CAPS capsule Take 50,000 Units by mouth every 7 (seven) days.     No current  facility-administered medications on file prior to visit.         Objective:   Physical Exam Blood pressure 122/74, pulse 84, temperature 98.1 F (36.7 C), height  (1.727 m), weight 131 lb 8 oz (59.6 kg). Alert and oriented. Skin warm and dry. Oral mucosa is moist.   . Sclera anicteric, conjunctivae is pink. Thyroid not enlarged. No cervical lymphadenopathy. Lungs clear. Heart regular rate and rhythm.  Abdomen is soft. Bowel sounds are positive. No hepatomegaly. No abdominal masses felt. No tenderness.  No edema to lower extremities.          Assessment & Plan:  GERD. He has very little heart burn at this time. Continue the OTC acid reducer as needed.

## 2017-09-10 NOTE — Patient Instructions (Signed)
OV in 1 year.  

## 2017-09-10 NOTE — Progress Notes (Signed)
Office Visit Note   Patient: Jason Lamb           Date of Birth: 07-22-1961           MRN: [id number] Visit Date: 09/05/2017 Requested by: [name], MD [address] [place], [place] [address] PCP: [name], MD  Subjective: Chief Complaint  Patient presents with  . Neck - Follow-up    HPI: chains is a 56 year old patient with neck pain.  Since I have seen him he has had an MRI scan of his cervical spine.  He has been out of work.  Scan shows multilevel degenerative disc disease with facet arthritis and moderate spinal stenosis.  He would like to go back to work. Scan is reviewed with the patient and he does have multilevel moderate spinal stenosis.              ROS: All systems reviewed are negative as they relate to the chief complaint within the history of present illness.  Patient denies  fevers or chills.   Assessment & Plan: Visit Diagnoses:  1. Neck pain   2. Cervical radiculopathy     Plan: impression is cervical radiculopathy with exacerbation of existing spinal stenosis and facet arthritis from his injury.  I think it's okay for him to return to work Monday.  Hydrocodone muscle relaxer refill 1 only.  Refer to Dr. Alvester Morin for cervical spine ESI  Follow-up with me as needed  Follow-Up Instructions: No Follow-up on file.   Orders:  Orders Placed This Encounter  Procedures  . Ambulatory referral to Physical Medicine Rehab   No orders of the defined types were placed in this encounter.     Procedures: No procedures performed   Clinical Data: No additional findings.  Objective: Vital Signs: There were no vitals taken for this visit.  Physical Exam:   Constitutional: Patient appears well-developed HEENT:  Head: Normocephalic Eyes:EOM are normal Neck: Normal range of motion Cardiovascular: Normal rate Pulmonary/chest: Effort normal Neurologic: Patient is alert Skin: Skin is warm Psychiatric: Patient has normal mood and  affect    Ortho Exam: orthopedic exam demonstrates normal gait and alignment symmetric reflexes good upper chemise strength with no definite paresthesias C5 T1.  Does have pain with rotation to the left and right foot flexion and extension is somewhat diminished.  Radial pulses intact bilaterally.  Specialty Comments:  No specialty comments available.  Imaging: No results found.   PMFS History: Patient Active Problem List   Diagnosis Date Noted  . Loss of weight 08/20/2014  . H. pylori infection 07/15/2014  . GERD (gastroesophageal reflux disease) 03/10/2014  . Diarrhea 03/10/2014  . Graves disease 03/10/2014  . Depression 03/10/2014   Past Medical History:  Diagnosis Date  . Anxiety   . Bronchitis   . Chronic back pain   . Depression   . GERD (gastroesophageal reflux disease)   . Graves disease   . IBS (irritable bowel syndrome)     No family history on file.  Past Surgical History:  Procedure Laterality Date  . APPENDECTOMY    . COLONOSCOPY N/A 04/08/2014   Procedure: COLONOSCOPY;  Surgeon: Malissa Hippo, MD;  [facility];  Service: Endoscopy;  Laterality: N/A;  135  . ESOPHAGOGASTRODUODENOSCOPY N/A 04/08/2014   Procedure: ESOPHAGOGASTRODUODENOSCOPY (EGD);  Surgeon: Malissa Hippo, MD;  [facility];  Service: Endoscopy;  Laterality: N/A;   Social History   Occupational History  . Not on file.   Social History  Main Topics  . Smoking status: Current Every Day Smoker    Packs/day: 1.00    Years: 35.00    Types: Cigarettes  . Smokeless tobacco: Never Used     Comment: 1 pack a day  . Alcohol use 7.2 oz/week    12 Cans of beer per week     Comment: daily x 12 beers a day usually.  . Drug use: No     Comment: quit 08/2014  . Sexual activity: Not on file

## 2017-09-13 ENCOUNTER — Ambulatory Visit (INDEPENDENT_AMBULATORY_CARE_PROVIDER_SITE_OTHER): Payer: Self-pay | Admitting: Orthopedic Surgery

## 2017-09-19 ENCOUNTER — Encounter (INDEPENDENT_AMBULATORY_CARE_PROVIDER_SITE_OTHER): Payer: Self-pay | Admitting: Physical Medicine and Rehabilitation

## 2017-09-19 ENCOUNTER — Ambulatory Visit (INDEPENDENT_AMBULATORY_CARE_PROVIDER_SITE_OTHER): Payer: Worker's Compensation | Admitting: Physical Medicine and Rehabilitation

## 2017-09-19 ENCOUNTER — Ambulatory Visit (INDEPENDENT_AMBULATORY_CARE_PROVIDER_SITE_OTHER): Payer: Worker's Compensation

## 2017-09-19 VITALS — BP 139/78 | HR 81

## 2017-09-19 DIAGNOSIS — M4802 Spinal stenosis, cervical region: Secondary | ICD-10-CM

## 2017-09-19 DIAGNOSIS — M5412 Radiculopathy, cervical region: Secondary | ICD-10-CM

## 2017-09-19 MED ORDER — BETAMETHASONE SOD PHOS & ACET 6 (3-3) MG/ML IJ SUSP
12.0000 mg | Freq: Once | INTRAMUSCULAR | Status: AC
  Administered 2017-09-19: 12 mg

## 2017-09-19 MED ORDER — LIDOCAINE HCL (PF) 1 % IJ SOLN
2.0000 mL | Freq: Once | INTRAMUSCULAR | Status: AC
  Administered 2017-09-19: 2 mL

## 2017-09-19 NOTE — Patient Instructions (Signed)

## 2017-09-27 ENCOUNTER — Encounter (INDEPENDENT_AMBULATORY_CARE_PROVIDER_SITE_OTHER): Payer: Self-pay | Admitting: Orthopedic Surgery

## 2017-09-27 ENCOUNTER — Ambulatory Visit (INDEPENDENT_AMBULATORY_CARE_PROVIDER_SITE_OTHER): Payer: Worker's Compensation | Admitting: Orthopedic Surgery

## 2017-09-27 DIAGNOSIS — M542 Cervicalgia: Secondary | ICD-10-CM

## 2017-09-28 NOTE — Procedures (Signed)
[name] is a right-hand-dominant 56 year old gentleman followed by Dr. August Saucerean.  He has been having worsening neck and left shoulder pain.  MRI was obtained of the cervical spine.  This shows multilevel cervical stenosis which is pretty significant from C3-4 to C5-6.  There is mild to moderate stenosis even at C6-7.  There is no specific cord abnormalities but there is definitely impingement around the cord.  After reviewing images and the patient we are going to complete a diagnostic and hopefully therapeutic C7-T1 intralaminar epidural steroid injection.  With likely in the long-term the patient will require cervical decompression with concerns for cord compression.  Cervical Epidural Steroid Injection - Interlaminar Approach with Fluoroscopic Guidance  Patient: Jason Lamb      Date of Birth: 11/28/1960 MRN: [id number] PCP: Joette CatchingNyland, Leonard, MD      Visit Date: 09/19/2017   Universal Protocol:    Date/Time: 11/02/186:03 AM  Consent Given By: the patient  Position: PRONE  Additional Comments: Vital signs were monitored before and after the procedure. Patient was prepped and draped in the usual sterile fashion. The correct patient, procedure, and site was verified.   Injection Procedure Details:  Procedure Site One Meds Administered:  Meds ordered this encounter  Medications  . lidocaine (PF) (XYLOCAINE) 1 % injection 2 mL  . betamethasone acetate-betamethasone sodium phosphate (CELESTONE) injection 12 mg     Laterality: Left  Location/Site:  C7-T1  Needle size: 20 G  Needle type: Touhy  Needle Placement: Paramedian epidural space  Findings:  -Contrast Used: 0.5 mL iohexol 180 mg iodine/mL   -Comments: Excellent flow of contrast into the epidural space.  Procedure Details: Using a paramedian approach from the side mentioned above, the region overlying the inferior lamina was localized under fluoroscopic visualization and the soft tissues overlying this structure were  infiltrated with 4 ml. of 1% Lidocaine without Epinephrine. A # 20 gauge, Tuohy needle was inserted into the epidural space using a paramedian approach.  The epidural space was localized using loss of resistance along with lateral and contralateral oblique bi-planar fluoroscopic views.  After negative aspirate for air, blood, and CSF, a 2 ml. volume of Isovue-250 was injected into the epidural space and the flow of contrast was observed. Radiographs were obtained for documentation purposes.   The injectate was administered into the level noted above.  Additional Comments:  The patient tolerated the procedure well Dressing: Band-Aid    Post-procedure details: Patient was observed during the procedure. Post-procedure instructions were reviewed.  Patient left the clinic in stable condition.

## 2017-09-30 NOTE — Progress Notes (Signed)
Office Visit Note   Patient: Jason Lamb           Date of Birth: 02/01/1961           MRN: [id number] Visit Date: 09/27/2017 Requested by: Joette CatchingNyland, Leonard, MD [address] [place], [place] [id number] PCP: Joette CatchingNyland, Leonard, MD  Subjective: Chief Complaint  Patient presents with  . Neck - Follow-up    HPI: Jason FearingJames Lamb is a 56 year old patient with cervical spine pain which was a work injury.  Since I have seen him he has had an injection with Dr. Alvester MorinNewton which gave him 50% relief.  It is still painful but not as intense.  MRI scan shows cervical arthritis and some bulging disc.  He has been working in his neck is not been throbbing as much.  He is a smoker and he has been taking ibuprofen.  He can currently live with his symptoms but he's not sure if this current level of relief will be maintained              ROS: All systems reviewed are negative as they relate to the chief complaint within the history of present illness.  Patient denies  fevers or chills.   Assessment & Plan: Visit Diagnoses:  1. Neck pain     Plan: Impression is cervical spine pain improved with injection.  Plan 8 week return for final clinical check.  He may need to decide if he wants another injection or surgical evaluation if his symptoms recur.  Continue with ibuprofen but I want to hold off on any stronger medication for now  Follow-Up Instructions: Return in about 8 weeks (around 11/22/2017).   Orders:  No orders of the defined types were placed in this encounter.  No orders of the defined types were placed in this encounter.     Procedures: No procedures performed   Clinical Data: No additional findings.  Objective: Vital Signs: There were no vitals taken for this visit.  Physical Exam:   Constitutional: Patient appears well-developed HEENT:  Head: Normocephalic Eyes:EOM are normal Neck: Normal range of motion Cardiovascular: Normal rate Pulmonary/chest: Effort normal Neurologic:  Patient is alert Skin: Skin is warm Psychiatric: Patient has normal mood and affect    Ortho Exam: Orthopedic exam demonstrates pretty good cervical spine range of motion 5 out of 5 grip EPL FPL interosseous wrist flexion-extension biceps triceps and deltoid strength with palpable radial pulse and palpable reflexes.  Reflexes are symmetric bilateral biceps and triceps 1+ out of 4.  Cervical spine range of motion is a little bit restricted with flexion and extension to about half a normal arc.  Also with rotation is about 50 both sides.  No definite paresthesias C5-T1.  No muscle atrophy in the upper extremities.  Specialty Comments:  No specialty comments available.  Imaging: No results found.   PMFS History: Patient Active Problem List   Diagnosis Date Noted  . Loss of weight 08/20/2014  . H. pylori infection 07/15/2014  . GERD (gastroesophageal reflux disease) 03/10/2014  . Diarrhea 03/10/2014  . Graves disease 03/10/2014  . Depression 03/10/2014   Past Medical History:  Diagnosis Date  . Anxiety   . Bronchitis   . Chronic back pain   . Depression   . GERD (gastroesophageal reflux disease)   . Graves disease   . IBS (irritable bowel syndrome)     No family history on file.  Past Surgical History:  Procedure Laterality Date  . APPENDECTOMY  Social History   Occupational History  . Not on file  Tobacco Use  . Smoking status: Current Every Day Smoker    Packs/day: 1.00    Years: 35.00    Pack years: 35.00    Types: Cigarettes  . Smokeless tobacco: Never Used  . Tobacco comment: 1 pack a day  Substance and Sexual Activity  . Alcohol use: Yes    Alcohol/week: 7.2 oz    Types: 12 Cans of beer per week    Comment: daily x 12 beers a day usually.  . Drug use: No    Comment: quit 08/2014  . Sexual activity: Not on file

## 2017-10-25 ENCOUNTER — Telehealth (INDEPENDENT_AMBULATORY_CARE_PROVIDER_SITE_OTHER): Payer: Self-pay

## 2017-10-25 NOTE — Telephone Encounter (Signed)
Emailed Dr. Alfonso Patteneans last office note and the injection note from Dr. Alvester MorinNewton to the case mgr per his request.

## 2017-12-05 ENCOUNTER — Ambulatory Visit (INDEPENDENT_AMBULATORY_CARE_PROVIDER_SITE_OTHER): Payer: Worker's Compensation | Admitting: Orthopedic Surgery

## 2017-12-05 ENCOUNTER — Encounter (INDEPENDENT_AMBULATORY_CARE_PROVIDER_SITE_OTHER): Payer: Self-pay | Admitting: Orthopedic Surgery

## 2017-12-05 DIAGNOSIS — M542 Cervicalgia: Secondary | ICD-10-CM

## 2017-12-07 ENCOUNTER — Encounter (INDEPENDENT_AMBULATORY_CARE_PROVIDER_SITE_OTHER): Payer: Self-pay | Admitting: Orthopedic Surgery

## 2017-12-07 NOTE — Progress Notes (Signed)
Office Visit Note   Patient: Jason Lamb           Date of Birth: 1961/07/04           MRN: [id number] Visit Date: 12/05/2017 Requested by: [name], MD [address] [place], [place] [address] PCP: [name], MD  Subjective: Chief Complaint  Patient presents with  . Neck - Follow-up    HPI: Jason Lamb is a patient with neck pain.  Had an injection 09/19/2017 and he is doing better.  Patient states he has good and bad days.  He got about 50% relief of his symptoms from the injection.  He is doing regular work.  He takes ibuprofen occasionally.  He states that it is hard to look to the left at times but not all the time.              ROS: All systems reviewed are negative as they relate to the chief complaint within the history of present illness.  Patient denies  fevers or chills.   Assessment & Plan: Visit Diagnoses:  1. Neck pain     Plan: Impression is neck pain following work injury which is been successfully treated with one epidural steroid injection.  If he has recurrent symptoms not relieved by anti-inflammatories I want him to call me and we can get him set up for another injection.  For now I am going to release him to activity as tolerated.  Follow-up with me as needed.  Follow-Up Instructions: Return if symptoms worsen or fail to improve.   Orders:  No orders of the defined types were placed in this encounter.  No orders of the defined types were placed in this encounter.     Procedures: No procedures performed   Clinical Data: No additional findings.  Objective: Vital Signs: There were no vitals taken for this visit.  Physical Exam:   Constitutional: Patient appears well-developed HEENT:  Head: Normocephalic Eyes:EOM are normal Neck: Normal range of motion Cardiovascular: Normal rate Pulmonary/chest: Effort normal Neurologic: Patient is alert Skin: Skin is warm Psychiatric: Patient has normal mood and affect    Ortho Exam:  Orthopedic exam demonstrates full active and passive range of motion of both arms and shoulders.  Neck range of motion is pretty reasonable with no real significant limitation with rotation to the left.  No paresthesias C5-T1.  Reflexes symmetric.  Bilateral radial pulses intact.  No atrophy in either arm.  Specialty Comments:  No specialty comments available.  Imaging: No results found.   PMFS History: Patient Active Problem List   Diagnosis Date Noted  . Loss of weight 08/20/2014  . H. pylori infection 07/15/2014  . GERD (gastroesophageal reflux disease) 03/10/2014  . Diarrhea 03/10/2014  . Graves disease 03/10/2014  . Depression 03/10/2014   Past Medical History:  Diagnosis Date  . Anxiety   . Bronchitis   . Chronic back pain   . Depression   . GERD (gastroesophageal reflux disease)   . Graves disease   . IBS (irritable bowel syndrome)     History reviewed. No pertinent family history.  Past Surgical History:  Procedure Laterality Date  . APPENDECTOMY    . COLONOSCOPY N/A 04/08/2014   Procedure: COLONOSCOPY;  Surgeon: Malissa Hippo, MD;  [facility];  Service: Endoscopy;  Laterality: N/A;  135  . ESOPHAGOGASTRODUODENOSCOPY N/A 04/08/2014   Procedure: ESOPHAGOGASTRODUODENOSCOPY (EGD);  Surgeon: Malissa Hippo, MD;  [facility];  Service: Endoscopy;  Laterality: N/A;  Social History   Occupational History  . Not on file  Tobacco Use  . Smoking status: Current Every Day Smoker    Packs/day: 1.00    Years: 35.00    Pack years: 35.00    Types: Cigarettes  . Smokeless tobacco: Never Used  . Tobacco comment: 1 pack a day  Substance and Sexual Activity  . Alcohol use: Yes    Alcohol/week: 7.2 oz    Types: 12 Cans of beer per week    Comment: daily x 12 beers a day usually.  . Drug use: No    Comment: quit 08/2014  . Sexual activity: Not on file

## 2018-01-10 ENCOUNTER — Other Ambulatory Visit (HOSPITAL_COMMUNITY): Payer: Self-pay | Admitting: Adult Health Nurse Practitioner

## 2018-01-10 DIAGNOSIS — M8580 Other specified disorders of bone density and structure, unspecified site: Secondary | ICD-10-CM

## 2018-01-31 ENCOUNTER — Ambulatory Visit (HOSPITAL_COMMUNITY)
Admission: RE | Admit: 2018-01-31 | Discharge: 2018-01-31 | Disposition: A | Discharge status: Home / Self Care | Payer: BLUE CROSS/BLUE SHIELD | Source: Ambulatory Visit | Attending: Adult Health Nurse Practitioner | Admitting: Adult Health Nurse Practitioner

## 2018-01-31 DIAGNOSIS — M8580 Other specified disorders of bone density and structure, unspecified site: Secondary | ICD-10-CM | POA: Diagnosis present

## 2018-09-10 ENCOUNTER — Encounter (INDEPENDENT_AMBULATORY_CARE_PROVIDER_SITE_OTHER): Payer: Self-pay | Admitting: Internal Medicine

## 2018-09-10 ENCOUNTER — Encounter (INDEPENDENT_AMBULATORY_CARE_PROVIDER_SITE_OTHER): Payer: Self-pay | Admitting: *Deleted

## 2018-09-10 ENCOUNTER — Ambulatory Visit (INDEPENDENT_AMBULATORY_CARE_PROVIDER_SITE_OTHER): Payer: BLUE CROSS/BLUE SHIELD | Admitting: Internal Medicine

## 2018-09-10 VITALS — BP 116/68 | HR 76 | Temp 97.9°F | Ht 68.0 in | Wt 125.0 lb

## 2018-09-10 DIAGNOSIS — A048 Other specified bacterial intestinal infections: Secondary | ICD-10-CM | POA: Diagnosis not present

## 2018-09-10 DIAGNOSIS — A09 Infectious gastroenteritis and colitis, unspecified: Secondary | ICD-10-CM

## 2018-09-10 NOTE — Progress Notes (Signed)
   Subjective:    Patient ID: Jason Lamb, male    DOB: May 17, 1961, 57 y.o.   MRN: [id number]  HPI Here today for f/u. Last seen 1 yr ago. Wt in October 2019 was 131. Today his weight is 125. Has not drank etoh in 5 yrs.  Hx of chronic GERD. States has the Protonix again. He says he is having loose stools. He can eat a hamburger and stools are normal. Other foods cause him diarrhea. Symptoms for a couple of weeks. States these are the same symptoms he had when he had H. Pylori. Does not have the vomiting however.  Hx of H. Pylori in past. His appetite is okay.  Has a BM x 3-4 a day here recently. No melena or BRRB. His last EGD was in 2015     04/08/2014 EGD/Colonoscopy:  EGD findings;  Erosive reflux esophagitis.  Nonerosive gastroduodenitis.  Colonoscopy findings;  Normal mucosa of terminal ileum.  Scattered diverticula throughout the colon.  Small external hemorrhoids and anal papillae.  H. Pylori positive.  Review of Systems Past Medical History:  Diagnosis Date  . Anxiety   . Bronchitis   . Chronic back pain   . Depression   . GERD (gastroesophageal reflux disease)   . Graves disease   . IBS (irritable bowel syndrome)     Past Surgical History:  Procedure Laterality Date  . APPENDECTOMY    . COLONOSCOPY N/A 04/08/2014   Procedure: COLONOSCOPY;  Surgeon: Malissa Hippo, MD;  [facility];  Service: Endoscopy;  Laterality: N/A;  135  . ESOPHAGOGASTRODUODENOSCOPY N/A 04/08/2014   Procedure: ESOPHAGOGASTRODUODENOSCOPY (EGD);  Surgeon: Malissa Hippo, MD;  [facility];  Service: Endoscopy;  Laterality: N/A;    Allergies  Allergen Reactions  . Penicillins Other (See Comments)    Childhood allergy    Current Outpatient Medications on File Prior to Visit  Medication Sig Dispense Refill  . ALPRAZolam (XANAX) 1 MG tablet Take 1-2 mg by mouth at bedtime.     . cyclobenzaprine (FLEXERIL) 10 MG tablet Take by mouth as needed.     . dicyclomine  (BENTYL) 10 MG capsule TAKE 1 CAPSULE (10 MG TOTAL)  BY MOUTH 3 (THREE) TIMES DAILY BEFORE MEA LS. 90 capsule 5  . HYDROcodone-acetaminophen (NORCO/VICODIN) 5-325 MG per tablet Take 1 tablet by mouth 3 (three) times daily as needed for moderate pain.     . pantoprazole (PROTONIX) 40 MG tablet Take by mouth daily.     . Vitamin D, Ergocalciferol, (DRISDOL) 50000 units CAPS capsule Take 50,000 Units by mouth every 7 (seven) days.    Marland Kitchen escitalopram (LEXAPRO) 10 MG tablet Take 1 tab PO once daily     No current facility-administered medications on file prior to visit.         Objective:   Physical Exam Blood pressure 116/68, pulse 76, temperature 97.9 F (36.6 C), height 5\' 8"  (1.727 m), weight 125 lb (56.7 kg).  Alert and oriented. Skin warm and dry. Oral mucosa is moist.   . Sclera anicteric, conjunctivae is pink. Thyroid not enlarged. No cervical lymphadenopathy. Lungs clear. Heart regular rate and rhythm.  Abdomen is soft. Bowel sounds are positive. No hepatomegaly. No abdominal masses felt. No tenderness.  No edema to lower extremities.          Assessment & Plan:  Diarrhea. Will get a GI pathogen. Hx of H. Pylori. Will get an H. Pylori today.

## 2018-09-10 NOTE — Patient Instructions (Signed)
Labs today

## 2018-09-12 LAB — GASTROINTESTINAL PATHOGEN PANEL PCR
C. DIFFICILE TOX A/B, PCR: NOT DETECTED
CAMPYLOBACTER, PCR: NOT DETECTED
Cryptosporidium, PCR: NOT DETECTED
E coli (ETEC) LT/ST PCR: NOT DETECTED
E coli (STEC) stx1/stx2, PCR: NOT DETECTED
E coli 0157, PCR: NOT DETECTED
GIARDIA LAMBLIA, PCR: NOT DETECTED
Norovirus, PCR: NOT DETECTED
ROTAVIRUS, PCR: NOT DETECTED
SHIGELLA, PCR: NOT DETECTED
Salmonella, PCR: NOT DETECTED

## 2018-09-12 LAB — H. PYLORI BREATH TEST: H. PYLORI BREATH TEST: NOT DETECTED

## 2019-01-03 ENCOUNTER — Emergency Department (HOSPITAL_COMMUNITY): Payer: BLUE CROSS/BLUE SHIELD

## 2019-01-03 ENCOUNTER — Emergency Department (HOSPITAL_COMMUNITY)
Admission: EM | Admit: 2019-01-03 | Discharge: 2019-01-03 | Disposition: A | Discharge status: Home / Self Care | Payer: BLUE CROSS/BLUE SHIELD | Attending: Emergency Medicine | Admitting: Emergency Medicine

## 2019-01-03 ENCOUNTER — Encounter (HOSPITAL_COMMUNITY): Payer: Self-pay | Admitting: *Deleted

## 2019-01-03 ENCOUNTER — Other Ambulatory Visit: Payer: Self-pay

## 2019-01-03 DIAGNOSIS — F17210 Nicotine dependence, cigarettes, uncomplicated: Secondary | ICD-10-CM | POA: Insufficient documentation

## 2019-01-03 DIAGNOSIS — Z79899 Other long term (current) drug therapy: Secondary | ICD-10-CM | POA: Diagnosis not present

## 2019-01-03 DIAGNOSIS — R569 Unspecified convulsions: Secondary | ICD-10-CM | POA: Diagnosis not present

## 2019-01-03 HISTORY — DX: Unspecified osteoarthritis, unspecified site: M19.90

## 2019-01-03 HISTORY — DX: Dorsalgia, unspecified: M54.9

## 2019-01-03 LAB — COMPREHENSIVE METABOLIC PANEL
ALBUMIN: 4.4 g/dL (ref 3.5–5.0)
ALK PHOS: 84 U/L (ref 38–126)
ALT: 11 U/L (ref 0–44)
ANION GAP: 10 (ref 5–15)
AST: 17 U/L (ref 15–41)
BILIRUBIN TOTAL: 0.7 mg/dL (ref 0.3–1.2)
BUN: 6 mg/dL (ref 6–20)
CALCIUM: 8.5 mg/dL — AB (ref 8.9–10.3)
CO2: 25 mmol/L (ref 22–32)
Chloride: 101 mmol/L (ref 98–111)
Creatinine, Ser: 0.79 mg/dL (ref 0.61–1.24)
GFR calc non Af Amer: >60 mL/min (ref >60)
GLUCOSE: 92 mg/dL (ref 70–99)
Potassium: 3.7 mmol/L (ref 3.5–5.1)
Sodium: 136 mmol/L (ref 135–145)
Total Protein: 7 g/dL (ref 6.5–8.1)

## 2019-01-03 LAB — CBC WITH DIFFERENTIAL/PLATELET
ABS IMMATURE GRANULOCYTES: 0.02 10*3/uL (ref 0.00–0.07)
BASOS ABS: 0.1 10*3/uL (ref 0.0–0.1)
Basophils Relative: 1 %
EOS PCT: 2 %
Eosinophils Absolute: 0.2 10*3/uL (ref 0.0–0.5)
HEMATOCRIT: 44 % (ref 39.0–52.0)
HEMOGLOBIN: 14.4 g/dL (ref 13.0–17.0)
Immature Granulocytes: 0 %
Lymphocytes Relative: 22 %
Lymphs Abs: 2.1 10*3/uL (ref 0.7–4.0)
MCH: 28.6 pg (ref 26.0–34.0)
MCHC: 32.7 g/dL (ref 30.0–36.0)
MCV: 87.3 fL (ref 80.0–100.0)
Monocytes Absolute: 0.7 10*3/uL (ref 0.1–1.0)
Monocytes Relative: 7 %
NRBC: 0 % (ref 0.0–0.2)
Neutro Abs: 6.5 10*3/uL (ref 1.7–7.7)
Neutrophils Relative %: 68 %
Platelets: 260 10*3/uL (ref 150–400)
RBC: 5.04 MIL/uL (ref 4.22–5.81)
RDW: 13.4 % (ref 11.5–15.5)
WBC: 9.4 10*3/uL (ref 4.0–10.5)

## 2019-01-03 LAB — URINALYSIS, ROUTINE W REFLEX MICROSCOPIC
BILIRUBIN URINE: NEGATIVE
GLUCOSE, UA: NEGATIVE mg/dL
Hgb urine dipstick: NEGATIVE
KETONES UR: 5 mg/dL — AB
LEUKOCYTES UA: NEGATIVE
Nitrite: NEGATIVE
PH: 7 (ref 5.0–8.0)
PROTEIN: NEGATIVE mg/dL
Specific Gravity, Urine: 1.004 — ABNORMAL LOW (ref 1.005–1.030)

## 2019-01-03 LAB — RAPID URINE DRUG SCREEN, HOSP PERFORMED
Amphetamines: NOT DETECTED
BARBITURATES: NOT DETECTED
BENZODIAZEPINES: NOT DETECTED
Cocaine: NOT DETECTED
Opiates: POSITIVE — AB
Tetrahydrocannabinol: NOT DETECTED

## 2019-01-03 NOTE — ED Provider Notes (Signed)
[facility] EMERGENCY DEPARTMENT Provider Note   CSN: [id number] Arrival date & time: 01/03/19  [id number]     History   Chief Complaint Chief Complaint  Patient presents with  . Seizures    HPI Jason Lamb is a 58 y.o. male.  The history is provided by the patient. No language interpreter was used.  Seizures  Seizure activity on arrival: yes   Seizure type:  Grand mal Preceding symptoms: no nausea   Initial focality:  None Episode characteristics: abnormal movements, confusion, generalized shaking and unresponsiveness   Postictal symptoms: no confusion   Return to baseline: yes   Severity:  Moderate Duration:  2 minutes Timing:  Once Number of seizures this episode:  1 Context: not alcohol withdrawal, not cerebral palsy, not change in medication, not sleeping less, not developmental delay, not drug use, not emotional upset, not family hx of seizures, not fever, not flashing visual stimuli, not hydrocephalus, not intracranial lesion, not intracranial shunt, medical compliance, not possible hypoglycemia, not possible medication ingestion, not previous head injury and not stress   Recent head injury:  No recent head injuries PTA treatment:  None History of seizures: no   Pt's son witness event.  Seizure lasted for 2 minutes,  Pt was confused but returned to normal   Past Medical History:  Diagnosis Date  . Anxiety   . Arthritis    neck, lumbar spine  . Back pain   . Bronchitis   . Chronic back pain   . Depression   . GERD (gastroesophageal reflux disease)   . Graves disease   . IBS (irritable bowel syndrome)     Patient Active Problem List   Diagnosis Date Noted  . Loss of weight 08/20/2014  . H. pylori infection 07/15/2014  . GERD (gastroesophageal reflux disease) 03/10/2014  . Diarrhea 03/10/2014  . Graves disease 03/10/2014  . Depression 03/10/2014    Past Surgical History:  Procedure Laterality Date  . APPENDECTOMY    . COLONOSCOPY N/A 04/08/2014   Procedure: COLONOSCOPY;  Surgeon: Malissa HippoNajeeb U Rehman, MD;  Location: [facility];  Service: Endoscopy;  Laterality: N/A;  135  . ESOPHAGOGASTRODUODENOSCOPY N/A 04/08/2014   Procedure: ESOPHAGOGASTRODUODENOSCOPY (EGD);  Surgeon: Malissa HippoNajeeb U Rehman, MD;  Location: [facility];  Service: Endoscopy;  Laterality: N/A;        Home Medications    Prior to Admission medications   Medication Sig Start Date End Date Taking? Authorizing Provider  ALPRAZolam Prudy Feeler(XANAX) 1 MG tablet Take 1-2 mg by mouth at bedtime.    Yes [provider]  buPROPion (WELLBUTRIN XL) 150 MG 24 hr tablet Take 1 tab daily x 2 weeks then increase to 2 tabs daily thereafter 09/10/18  Yes [provider]  cyclobenzaprine (FLEXERIL) 10 MG tablet Take 10 mg by mouth daily as needed for muscle spasms.  07/12/17  Yes [provider]  dicyclomine (BENTYL) 10 MG capsule TAKE 1 CAPSULE (10 MG TOTAL)  BY MOUTH 3 (THREE) TIMES DAILY BEFORE MEA LS.   Yes Malissa Hippoehman, Najeeb U, MD  escitalopram (LEXAPRO) 10 MG tablet Take 1 tab PO once daily 04/05/17 01/03/19 Yes [provider]  HYDROcodone-acetaminophen (NORCO/VICODIN) 5-325 MG per tablet Take 1 tablet by mouth 3 (three) times daily as needed for moderate pain.    Yes [provider]  meloxicam (MOBIC) 7.5 MG tablet Take 7.5 mg by mouth daily.  01/01/19  Yes [provider]  pantoprazole (PROTONIX) 40 MG tablet Take by mouth daily.  04/05/17  Yes [provider]  Vitamin D, Ergocalciferol, (DRISDOL) 50000 units CAPS capsule Take 50,000 Units by mouth every 7 (seven) days.   Yes [provider]    Family History No family history on file.  Social History Social History   Tobacco Use  . Smoking status: Current Every Day Smoker    Packs/day: 1.00    Years: 35.00    Pack years: 35.00    Types: Cigarettes  . Smokeless tobacco: Never Used  . Tobacco comment: 1 pack a day  Substance Use Topics  . 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 Drug use: Not Currently    Types: Marijuana    Comment: quit 08/2014     Allergies   Penicillins   Review of Systems Review of Systems  Neurological: Positive for seizures.  All other systems reviewed and are negative.    Physical Exam Updated Vital Signs BP 127/90   Pulse 69   Temp 98.4 F (36.9 C) (Oral)   Resp 16   Ht 5\' 8"  (1.727 m)   Wt 59 kg   SpO2 100%   BMI 19.77 kg/m   Physical Exam Vitals signs and nursing note reviewed.  Constitutional:      Appearance: He is well-developed.  HENT:     Head: Normocephalic.     Right Ear: Tympanic membrane normal.     Left Ear: Tympanic membrane normal.     Nose: Nose normal.     Mouth/Throat:     Mouth: Mucous membranes are moist.  Eyes:     Pupils: Pupils are equal, round, and reactive to light.  Neck:     Musculoskeletal: Normal range of motion.  Cardiovascular:     Rate and Rhythm: Normal rate.  Pulmonary:     Effort: Pulmonary effort is normal.  Abdominal:     General: There is no distension.  Musculoskeletal: Normal range of motion.  Skin:    General: Skin is warm.  Neurological:     Mental Status: He is alert and oriented to person, place, and time.  Psychiatric:        Mood and Affect: Mood normal.      ED Treatments / Results  Labs (all labs ordered are listed, but only abnormal results are displayed) Labs Reviewed  COMPREHENSIVE METABOLIC PANEL - Abnormal; Notable for the following components:      Result Value   Calcium 8.5 (*)    All other components within normal limits  URINALYSIS, ROUTINE W REFLEX MICROSCOPIC - Abnormal; Notable for the following components:   Color, Urine STRAW (*)    Specific Gravity, Urine 1.004 (*)    Ketones, ur 5 (*)    All other components within normal limits  RAPID URINE DRUG SCREEN, HOSP PERFORMED - Abnormal; Notable for  the following components:   Opiates POSITIVE (*)    All other components within normal limits  CBC WITH DIFFERENTIAL/PLATELET    EKG EKG Interpretation  Date/Time:  Friday January 03 2019 11:07:59 EST Ventricular Rate:  88 PR Interval:    QRS Duration: 86 QT Interval:  393 QTC Calculation: 476 R Axis:   88 Text Interpretation:  Sinus rhythm Nonspecific ST abnormality No significant change since last tracing Confirmed by Cathren Laine ([id number]) on 01/03/2019 11:12:11 AM   Radiology Dg Chest 2 View  Result Date: 01/03/2019 CLINICAL DATA:  Seizure last night. EXAM: CHEST - 2 VIEW COMPARISON:  February 04, 2014 FINDINGS: The heart size and mediastinal contours are within normal limits. Both lungs are clear. The visualized skeletal structures are unremarkable. IMPRESSION: No active cardiopulmonary disease. Electronically Signed   By: Sherian Rein M.D.   On: 01/03/2019 10:32   Ct Head Wo Contrast  Result Date: 01/03/2019 CLINICAL DATA:  Seizure EXAM: CT HEAD WITHOUT CONTRAST TECHNIQUE: Contiguous axial images were obtained from the base of the skull through the vertex without intravenous contrast. COMPARISON:  Brain MRI September 22, 2004 FINDINGS: Brain: The ventricles are normal in size and configuration. 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 There is no intracranial mass, hemorrhage, extra-axial fluid collection, or midline shift. The brain parenchyma appears unremarkable. No acute infarct evident. Vascular: There is no hyperdense vessel. There is no appreciable vascular calcification. Skull: The bony calvarium appears intact. Sinuses/Orbits: There is slight mucosal thickening in several ethmoid air cells. Visualized paranasal sinuses elsewhere appear clear. Orbits appear symmetric bilaterally. Other: Mastoid air cells are clear. 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 No demonstrable mass or hemorrhage. Brain parenchyma appears unremarkable. There  is mild mucosal thickening in several ethmoid air cells. Electronically Signed   By: Bretta Bang III M.D.   On: 01/03/2019 10:27    Procedures Procedures (including critical care time)  Medications Ordered in ED Medications - No data to display   Initial Impression / Assessment and Plan / ED Course  I have reviewed the triage vital signs and the nursing notes.  Pertinent labs & imaging results that were available during my care of the patient were reviewed by me and considered in my medical decision making (see chart for details).     Labs normal.  Ua shows opiates, no benzo's .   Pt given seizure precaustions and advised to schedule to see Neurology for ealtuion   Final Clinical Impressions(s) / ED Diagnoses   Final diagnoses:  Seizure [facility])    ED Discharge Orders    None    An After Visit Summary was printed and given to the patient.    Elson Areas, PA-C 01/03/19 1241    Cathren Laine, MD 01/03/19 [phone]

## 2019-01-03 NOTE — ED Triage Notes (Signed)
Pt reports he was at home last night and then had a seizure all of a sudden. His son witnessed it, reports it lasted approximately 2-2.5 minutes. Son reports he was "twitching" and about 30 seconds after it ended pt was alert and talking to him. Denies loss of bowel or bladder control during seizure. No hx of seizures. EMS came out to pt last night and encouraged him to come to ED but pt refused. Pt c/o back pain today. Son reports he slid out of a chair onto the floor when the seizure occurred.

## 2019-01-03 NOTE — Discharge Instructions (Signed)
Schedule to see the Neurologist.  No driving until evaluated.  Avoid any situation that could be dangerous.

## 2019-01-21 ENCOUNTER — Other Ambulatory Visit (HOSPITAL_COMMUNITY): Payer: Self-pay | Admitting: Student

## 2019-01-21 ENCOUNTER — Other Ambulatory Visit (HOSPITAL_COMMUNITY): Payer: Self-pay | Admitting: Neurology

## 2019-01-21 DIAGNOSIS — R569 Unspecified convulsions: Secondary | ICD-10-CM

## 2019-01-29 ENCOUNTER — Ambulatory Visit (HOSPITAL_COMMUNITY): Payer: BLUE CROSS/BLUE SHIELD

## 2019-02-05 ENCOUNTER — Ambulatory Visit (HOSPITAL_COMMUNITY)
Admission: RE | Admit: 2019-02-05 | Discharge: 2019-02-05 | Disposition: A | Discharge status: Home / Self Care | Payer: BLUE CROSS/BLUE SHIELD | Source: Ambulatory Visit | Attending: Neurology | Admitting: Neurology

## 2019-02-05 ENCOUNTER — Other Ambulatory Visit: Payer: Self-pay

## 2019-02-05 ENCOUNTER — Ambulatory Visit (HOSPITAL_COMMUNITY)
Admission: RE | Admit: 2019-02-05 | Discharge: 2019-02-05 | Disposition: A | Discharge status: Home / Self Care | Payer: BLUE CROSS/BLUE SHIELD | Source: Ambulatory Visit | Attending: Adult Health Nurse Practitioner | Admitting: Adult Health Nurse Practitioner

## 2019-02-05 ENCOUNTER — Other Ambulatory Visit (HOSPITAL_COMMUNITY): Payer: Self-pay | Admitting: Adult Health Nurse Practitioner

## 2019-02-05 DIAGNOSIS — M5440 Lumbago with sciatica, unspecified side: Secondary | ICD-10-CM

## 2019-02-05 DIAGNOSIS — R569 Unspecified convulsions: Secondary | ICD-10-CM | POA: Insufficient documentation

## 2019-02-05 DIAGNOSIS — M542 Cervicalgia: Secondary | ICD-10-CM

## 2019-02-05 MED ORDER — GADOBUTROL 1 MMOL/ML IV SOLN
6.0000 mL | Freq: Once | INTRAVENOUS | Status: AC | PRN
  Administered 2019-02-05: 6 mL via INTRAVENOUS

## 2019-09-11 ENCOUNTER — Ambulatory Visit (INDEPENDENT_AMBULATORY_CARE_PROVIDER_SITE_OTHER): Payer: BLUE CROSS/BLUE SHIELD | Admitting: Nurse Practitioner

## 2019-09-14 NOTE — Progress Notes (Deleted)
   Subjective:    Patient ID: Jason Lamb, male    DOB: [birth year], 58 y.o.   MRN: [id number]  HPI Jason Lamb. Shed is a 58 year old male with a past medical history of anxiety, depression, arthritis, Graves disease, IBS and GERD.   Check weight  EGD and colonoscopy 04/08/2014: EGD findings; Erosive reflux esophagitis. Nonerosive gastroduodenitis.  Colonoscopy findings; Normal mucosa of terminal ileum. Scattered diverticula throughout the colon. Small external hemorrhoids and anal papillae.  Past Medical History:  Diagnosis Date  . Anxiety   . Arthritis    neck, lumbar spine  . Back pain   . Bronchitis   . Chronic back pain   . Depression   . GERD (gastroesophageal reflux disease)   . Graves disease   . IBS (irritable bowel syndrome)       Review of Systems     Objective:   Physical Exam        Assessment & Plan:

## 2019-09-16 ENCOUNTER — Ambulatory Visit (INDEPENDENT_AMBULATORY_CARE_PROVIDER_SITE_OTHER): Payer: BLUE CROSS/BLUE SHIELD | Admitting: Nurse Practitioner

## 2019-09-26 ENCOUNTER — Other Ambulatory Visit: Payer: Self-pay

## 2019-09-26 DIAGNOSIS — Z20822 Contact with and (suspected) exposure to covid-19: Secondary | ICD-10-CM

## 2019-09-28 ENCOUNTER — Telehealth: Payer: Self-pay

## 2019-09-28 LAB — NOVEL CORONAVIRUS, NAA: SARS-CoV-2, NAA: NOT DETECTED

## 2019-09-28 NOTE — Telephone Encounter (Signed)
Patient called in requesting MyChart setup assistance and COVID19 lab results - DOB/Address verified - Negative results given, assisted with MyChart setup no further questions.

## 2020-06-13 ENCOUNTER — Emergency Department (HOSPITAL_COMMUNITY): Payer: 59

## 2020-06-13 ENCOUNTER — Other Ambulatory Visit: Payer: Self-pay

## 2020-06-13 ENCOUNTER — Emergency Department (HOSPITAL_COMMUNITY)
Admission: EM | Admit: 2020-06-13 | Discharge: 2020-06-13 | Disposition: A | Discharge status: Home / Self Care | Payer: 59 | Attending: Emergency Medicine | Admitting: Emergency Medicine

## 2020-06-13 DIAGNOSIS — R079 Chest pain, unspecified: Secondary | ICD-10-CM | POA: Diagnosis present

## 2020-06-13 DIAGNOSIS — M791 Myalgia, unspecified site: Secondary | ICD-10-CM | POA: Diagnosis not present

## 2020-06-13 DIAGNOSIS — F17210 Nicotine dependence, cigarettes, uncomplicated: Secondary | ICD-10-CM | POA: Insufficient documentation

## 2020-06-13 DIAGNOSIS — M2550 Pain in unspecified joint: Secondary | ICD-10-CM | POA: Insufficient documentation

## 2020-06-13 DIAGNOSIS — R0789 Other chest pain: Secondary | ICD-10-CM | POA: Diagnosis not present

## 2020-06-13 LAB — TROPONIN I (HIGH SENSITIVITY): Troponin I (High Sensitivity): <2 ng/L (ref <18)

## 2020-06-13 MED ORDER — IBUPROFEN 400 MG PO TABS
600.0000 mg | ORAL_TABLET | Freq: Once | ORAL | Status: AC
  Administered 2020-06-13: 14:00:00 600 mg via ORAL
  Filled 2020-06-13: qty 2

## 2020-06-13 MED ORDER — ACETAMINOPHEN 325 MG PO TABS: 650.0000 mg | ORAL_TABLET | Freq: Four times a day (QID) | ORAL | 0 refills | Status: DC | PRN

## 2020-06-13 MED ORDER — OXYCODONE HCL 5 MG PO TABS: 5.0000 mg | ORAL_TABLET | Freq: Four times a day (QID) | ORAL | 0 refills | Status: DC | PRN

## 2020-06-13 MED ORDER — OXYCODONE-ACETAMINOPHEN 5-325 MG PO TABS
1.0000 | ORAL_TABLET | Freq: Once | ORAL | Status: AC
  Administered 2020-06-13: 14:00:00 1 via ORAL
  Filled 2020-06-13: qty 1

## 2020-06-13 MED ORDER — IBUPROFEN 600 MG PO TABS
600.0000 mg | ORAL_TABLET | Freq: Four times a day (QID) | ORAL | 0 refills | Status: DC | PRN
Start: 2020-06-13 — End: 2021-12-02

## 2020-06-13 NOTE — ED Triage Notes (Signed)
Patient comes to the ED with left rib cage pain after working on a closet yesterday and one of the doors fell on his left side.  Patient reports having been diagnosed with vertigo and that the dizziness occurred when he may have staggered causing the door to fall on his side.

## 2020-06-13 NOTE — ED Provider Notes (Signed)
[facility] EMERGENCY DEPARTMENT Provider Note  CSN: [id number] Arrival date & time: 06/13/20  1126   History Chief Complaint  Patient presents with  . Chest Pain    left    Jason Lamb is a 59 y.o. male presented emergency department left-sided chest pain after an injury yesterday.  The patient reports he was helping his son clean the closet, and 1 to the doors, which is heavy, fell and struck him in the left side of chest.  He has had persistent pain in his left ribs since then.  It is worse with movement and breathing.  Nothing makes it better.  No hx of cardiac disease or MI.  HPI     Past Medical History:  Diagnosis Date  . Anxiety   . Arthritis    neck, lumbar spine  . Back pain   . Bronchitis   . Chronic back pain   . Depression   . GERD (gastroesophageal reflux disease)   . Graves disease   . IBS (irritable bowel syndrome)     Patient Active Problem List   Diagnosis Date Noted  . Loss of weight 08/20/2014  . H. pylori infection 07/15/2014  . GERD (gastroesophageal reflux disease) 03/10/2014  . Diarrhea 03/10/2014  . Graves disease 03/10/2014  . Depression 03/10/2014    Past Surgical History:  Procedure Laterality Date  . APPENDECTOMY    . COLONOSCOPY N/A 04/08/2014   Procedure: COLONOSCOPY;  Surgeon: Malissa Hippo, MD;  Location: [facility];  Service: Endoscopy;  Laterality: N/A;  135  . ESOPHAGOGASTRODUODENOSCOPY N/A 04/08/2014   Procedure: ESOPHAGOGASTRODUODENOSCOPY (EGD);  Surgeon: Malissa Hippo, MD;  Location: [facility];  Service: Endoscopy;  Laterality: N/A;       No family history on file.  Social History   Tobacco Use  . Smoking status: Current Every Day Smoker    Packs/day: 1.00    Years: 35.00    Pack years: 35.00    Types: Cigarettes  . Smokeless tobacco: Never Used  . Tobacco comment: 1 pack a day  Substance Use Topics  . 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 Drug use: Not Currently    Types: Marijuana    Comment: quit 08/2014    Home Medications Prior to Admission medications   Medication Sig Start Date End Date Taking? Authorizing Provider  DULoxetine (CYMBALTA) 30 MG capsule duloxetine 30 mg capsule,delayed release  TAKE 1 CAPSULE BY MOUTH EVERY DAY   Yes [provider]  gabapentin (NEURONTIN) 300 MG capsule Take 300 mg by mouth 3 (three) times daily. 04/30/20  Yes [provider]  HYDROcodone-acetaminophen (NORCO/VICODIN) 5-325 MG per tablet Take 1 tablet by mouth 3 (three) times daily as needed for moderate pain.    Yes [provider]  meloxicam (MOBIC) 15 MG tablet meloxicam 15 mg tablet  TAKE 1 TABLET BY MOUTH EVERY DAY   Yes [provider]  traZODone (DESYREL) 100 MG tablet trazodone 100 mg tablet  TAKE 1 TABLET BY MOUTH AT BEDTIME 01/09/19  Yes [provider]  zolpidem (AMBIEN) 10 MG tablet zolpidem 10 mg tablet  TAKE 1 TABLET AS NEEDED BY MOUTH AT BEDTIME.   Yes [provider]  acetaminophen (TYLENOL) 325 MG tablet Take 2 tablets (650 mg total) by mouth every 6 (six) hours  as needed for up to 30 doses for mild pain or moderate pain. 06/13/20   Terald Sleeper, MD  ibuprofen (ADVIL) 600 MG tablet Take 1 tablet (600 mg total) by mouth every 6 (six) hours as needed. 06/13/20   Terald Sleeper, MD  oxyCODONE (ROXICODONE) 5 MG immediate release tablet Take 1 tablet (5 mg total) by mouth every 6 (six) hours as needed for up to 10 doses for severe pain. 06/13/20   Terald Sleeper, MD  pantoprazole (PROTONIX) 40 MG tablet Take 40 mg by mouth daily as needed (acid reflux).  04/05/17   [provider]    Allergies    Penicillins  Review of Systems   Review of Systems  Constitutional: Negative for chills and fever.  HENT: Negative for ear pain and sore throat.   Eyes: Negative for pain and visual  disturbance.  Respiratory: Negative for cough and shortness of breath.   Cardiovascular: Positive for chest pain. Negative for palpitations.  Gastrointestinal: Negative for abdominal pain and vomiting.  Genitourinary: Negative for dysuria and hematuria.  Musculoskeletal: Positive for arthralgias and myalgias.  Skin: Negative for color change and rash.  Neurological: Negative for syncope and headaches.  All other systems reviewed and are negative.   Physical Exam Updated Vital Signs BP (!) 131/93 (BP Location: Left Arm)   Pulse 85   Temp 98.1 F (36.7 C) (Oral)   Resp 18   Ht 5\' 8"  (1.727 m)   Wt 58.1 kg   SpO2 98%   BMI 19.46 kg/m   Physical Exam Vitals and nursing note reviewed.  Constitutional:      Comments: Thin  HENT:     Head: Normocephalic and atraumatic.  Eyes:     Conjunctiva/sclera: Conjunctivae normal.  Cardiovascular:     Rate and Rhythm: Normal rate and regular rhythm.     Heart sounds: No murmur heard.   Pulmonary:     Effort: Pulmonary effort is normal. No respiratory distress.     Breath sounds: Normal breath sounds.  Abdominal:     Palpations: Abdomen is soft.     Tenderness: There is no abdominal tenderness.  Musculoskeletal:     Cervical back: Neck supple.     Comments: Left lateral ribline tenderness and chest wall tenderness  Skin:    General: Skin is warm and dry.  Neurological:     General: No focal deficit present.     Mental Status: He is alert and oriented to person, place, and time.     ED Results / Procedures / Treatments   Labs (all labs ordered are listed, but only abnormal results are displayed) Labs Reviewed  TROPONIN I (HIGH SENSITIVITY)    EKG EKG Interpretation  Date/Time:  Sunday June 13 2020 12:20:31 EDT Ventricular Rate:  85 PR Interval:    QRS Duration: 84 QT Interval:  364 QTC Calculation: 433 R Axis:   49 Text Interpretation: Sinus rhythm Diffuse minor ST elevations consistent with pericarditis, no  reciprocal changes noted, recommended clinical correlation Confirmed by [id number] [phone]) on 06/13/2020 12:51:51 PM   Radiology DG Chest 2 View  Result Date: 06/13/2020 CLINICAL DATA:  Door fell on pt while working on home. Pt states left axillary rib pain EXAM: CHEST - 2 VIEW COMPARISON:  Chest radiograph 02/04/2014 FINDINGS: The heart size and mediastinal contours are within normal limits. There are mild bandlike opacities in the bilateral lung bases favored to represent atelectasis. There is no pneumothorax or pleural effusion. No evidence  of a displaced rib fracture. IMPRESSION: 1. Mild bandlike opacities in the lung bases favored to represent atelectasis. 2. No displaced rib fracture identified. No pneumothorax or pleural effusion. Electronically Signed   By: Emmaline Kluver M.D.   On: 06/13/2020 13:42    Procedures Procedures (including critical care time)  Medications Ordered in ED Medications  oxyCODONE-acetaminophen (PERCOCET/ROXICET) 5-325 MG per tablet 1 tablet (1 tablet Oral Given 06/13/20 1409)  ibuprofen (ADVIL) tablet 600 mg (600 mg Oral Given 06/13/20 1409)    ED Course  I have reviewed the triage vital signs and the nursing notes.  Pertinent labs & imaging results that were available during my care of the patient were reviewed by me and considered in my medical decision making (see chart for details).  59 yo male presenting to ED with left sided chest pain after being struck in the chest by a heavy closet door yesterday.  He is well appearing on exam.  I reviewed his xrays here which show no obvious rib fractures or deformities but note some atelectasis in the lung bases.  He is afebrile and not hypoxic - I do not suspect PNA at this time.  An ECG was performed on arrival which per my interpretation showed diffuse very mild St elevations without reciprocal changes which could be consistent with BER; however I ordered a troponin to rule out cardiac contusion and this trop  was <2.  Therefore I have a lower suspicion for acute cardiac pathology at this time.    Most likely this is a rib contusion or small fractures not visible on xray.  We'll treat with PO pain meds, incentive spirometer, return precautions, and pcp f/u.  The patient is agreeable with this plan.  On reevaluation after PO pain medication he reports improvement of his pain from "a 10 to a 7."  We'll supplement this with motrin + tylenol and OTC lidoderm cream or patch.  Clinical Course as of Jun 13 1724  Sun Jun 13, 2020  1404  IMPRESSION: 1. Mild bandlike opacities in the lung bases favored to represent atelectasis.  2. No displaced rib fracture identified. No pneumothorax or pleural effusion.   [MT]  1404 With his tenderness on exam I still suspect this is likely a rib fracture, will plan for pain control, d/c with incentive spirometer and PCP f/u   [MT]    Clinical Course User Index [MT] Defne Gerling, Kermit Balo, MD     Final Clinical Impression(s) / ED Diagnoses Final diagnoses:  Chest wall pain    Rx / DC Orders ED Discharge Orders         Ordered    ibuprofen (ADVIL) 600 MG tablet  Every 6 hours PRN     Discontinue  Reprint     06/13/20 1550    acetaminophen (TYLENOL) 325 MG tablet  Every 6 hours PRN     Discontinue  Reprint     06/13/20 1550    oxyCODONE (ROXICODONE) 5 MG immediate release tablet  Every 6 hours PRN     Discontinue  Reprint     06/13/20 1550           Terald Sleeper, MD 06/13/20 1726

## 2020-06-13 NOTE — Discharge Instructions (Addendum)
Follow up with your doctor in 2 weeks.  Remember to use the incentive spirometer at home to prevent pneumonia from developing.  Read over the instructions attached for how to use this.

## 2021-03-16 ENCOUNTER — Ambulatory Visit: Payer: 59 | Admitting: Nurse Practitioner

## 2021-03-16 ENCOUNTER — Encounter: Payer: Self-pay | Admitting: Nurse Practitioner

## 2021-03-16 ENCOUNTER — Other Ambulatory Visit: Payer: Self-pay

## 2021-03-16 VITALS — BP 111/70 | HR 90 | Temp 98.1°F | Ht 68.0 in | Wt 132.0 lb

## 2021-03-16 DIAGNOSIS — M5442 Lumbago with sciatica, left side: Secondary | ICD-10-CM | POA: Diagnosis not present

## 2021-03-16 DIAGNOSIS — G8929 Other chronic pain: Secondary | ICD-10-CM | POA: Insufficient documentation

## 2021-03-16 DIAGNOSIS — F339 Major depressive disorder, recurrent, unspecified: Secondary | ICD-10-CM | POA: Diagnosis not present

## 2021-03-16 DIAGNOSIS — M5441 Lumbago with sciatica, right side: Secondary | ICD-10-CM | POA: Diagnosis not present

## 2021-03-16 MED ORDER — DULOXETINE HCL 60 MG PO CPEP: 60.0000 mg | ORAL_CAPSULE | Freq: Every day | ORAL | 1 refills | Status: DC

## 2021-03-16 NOTE — Assessment & Plan Note (Signed)
Orthopedic referral for reassessment of chronic bilateral lower back pain with bilateral sciatica worsening in the last few months. Referral completed.  Education provided to patient with printed handouts given.  Follow-up with worsening unresolved symptoms.

## 2021-03-16 NOTE — Assessment & Plan Note (Signed)
Depression not well managed on Cymbalta 30 mg capsule daily changed dose to 60 mg capsule once daily.  Education provided printed handouts given.  Rx sent to pharmacy.  Completed PHQ-9.

## 2021-03-16 NOTE — Patient Instructions (Signed)
Acute Back Pain, Adult Acute back pain is sudden and usually short-lived. It is often caused by an injury to the muscles and tissues in the back. The injury may result from:  A muscle or ligament getting overstretched or torn (strained). Ligaments are tissues that connect bones to each other. Lifting something improperly can cause a back strain.  Wear and tear (degeneration) of the spinal disks. Spinal disks are circular tissue that provide cushioning between the bones of the spine (vertebrae).  Twisting motions, such as while playing sports or doing yard work.  A hit to the back.  Arthritis. You may have a physical exam, lab tests, and imaging tests to find the cause of your pain. Acute back pain usually goes away with rest and home care. Follow these instructions at home: Managing pain, stiffness, and swelling  Treatment may include medicines for pain and inflammation that are taken by mouth or applied to the skin, prescription pain medicine, or muscle relaxants. Take over-the-counter and prescription medicines only as told by your health care provider.  Your health care provider may recommend applying ice during the first 24-48 hours after your pain starts. To do this: ? Put ice in a plastic bag. ? Place a towel between your skin and the bag. ? Leave the ice on for 20 minutes, 2-3 times a day.  If directed, apply heat to the affected area as often as told by your health care provider. Use the heat source that your health care provider recommends, such as a moist heat pack or a heating pad. ? Place a towel between your skin and the heat source. ? Leave the heat on for 20-30 minutes. ? Remove the heat if your skin turns bright red. This is especially important if you are unable to feel pain, heat, or cold. You have a greater risk of getting burned. Activity  Do not stay in bed. Staying in bed for more than 1-2 days can delay your recovery.  Sit up and stand up straight. Avoid leaning  forward when you sit or hunching over when you stand. ? If you work at a desk, sit close to it so you do not need to lean over. Keep your chin tucked in. Keep your neck drawn back, and keep your elbows bent at a 90-degree angle (right angle). ? Sit high and close to the steering wheel when you drive. Add lower back (lumbar) support to your car seat, if needed.  Take short walks on even surfaces as soon as you are able. Try to increase the length of time you walk each day.  Do not sit, drive, or stand in one place for more than 30 minutes at a time. Sitting or standing for long periods of time can put stress on your back.  Do not drive or use heavy machinery while taking prescription pain medicine.  Use proper lifting techniques. When you bend and lift, use positions that put less stress on your back: ? Bend your knees. ? Keep the load close to your body. ? Avoid twisting.  Exercise regularly as told by your health care provider. Exercising helps your back heal faster and helps prevent back injuries by keeping muscles strong and flexible.  Work with a physical therapist to make a safe exercise program, as recommended by your health care provider. Do any exercises as told by your physical therapist.   Lifestyle  Maintain a healthy weight. Extra weight puts stress on your back and makes it difficult to have   good posture.  Avoid activities or situations that make you feel anxious or stressed. Stress and anxiety increase muscle tension and can make back pain worse. Learn ways to manage anxiety and stress, such as through exercise. General instructions  Sleep on a firm mattress in a comfortable position. Try lying on your side with your knees slightly bent. If you lie on your back, put a pillow under your knees.  Follow your treatment plan as told by your health care provider. This may include: ? Cognitive or behavioral therapy. ? Acupuncture or massage therapy. ? Meditation or yoga. Contact  a health care provider if:  You have pain that is not relieved with rest or medicine.  You have increasing pain going down into your legs or buttocks.  Your pain does not improve after 2 weeks.  You have pain at night.  You lose weight without trying.  You have a fever or chills. Get help right away if:  You develop new bowel or bladder control problems.  You have unusual weakness or numbness in your arms or legs.  You develop nausea or vomiting.  You develop abdominal pain.  You feel faint. Summary  Acute back pain is sudden and usually short-lived.  Use proper lifting techniques. When you bend and lift, use positions that put less stress on your back.  Take over-the-counter and prescription medicines and apply heat or ice as directed by your health care provider. This information is not intended to replace advice given to you by your health care provider. Make sure you discuss any questions you have with your health care provider. Document Revised: 08/06/2020 Document Reviewed: 08/06/2020 Elsevier Patient Education  2021 [employer] Inc[employer]  

## 2021-03-16 NOTE — Progress Notes (Signed)
New Patient Note  RE: OSEPH IMBURGIA MRN: [id number] DOB: [birth year] Date of Office Visit: 03/16/2021  Chief Complaint: Back Pain    History of Present Illness:   Pain  He reports chronic Bilateral lower back pain. was not an injury that may have caused the pain. The pain started a few years ago and is gradually worsening. The pain does radiate bilateral lower extremity. The pain is described as aching, soreness and throbbing, is severe in intensity, occurring constantly. Symptoms are worse in the: morning, mid-day, afternoon, evening, nighttime  Aggravating factors: bending backwards, bending forwards, bending sideways and walking Relieving factors: none.  He has tried prescription pain relievers with little relief.   Depression: Patient complains of depression. He complains of depressed mood. Onset was approximately a few years ago, unchanged since that time.  He denies current suicidal and homicidal plan or intent.   Family history significant for no psychiatric illness.Possible organic causes contributing are: none.  Risk factors: previous episode of depression Previous treatment includes cymbalta and none. He complains of the following side effects from the treatment: none.      Assessment and Plan: Avigdor is a 60 y.o. male with: No problem-specific Assessment & Plan notes found for this encounter.  No follow-ups on file.   Diagnostics:   Past Medical History: Patient Active Problem List   Diagnosis Date Noted  . Loss of weight 08/20/2014  . H. pylori infection 07/15/2014  . GERD (gastroesophageal reflux disease) 03/10/2014  . Diarrhea 03/10/2014  . Graves disease 03/10/2014  . Depression 03/10/2014   Past Medical History:  Diagnosis Date  . Anxiety   . Arthritis    neck, lumbar spine  . Back pain   . Bronchitis   . Chronic back pain   . Depression   . GERD (gastroesophageal reflux disease)   . Graves disease   . IBS (irritable bowel syndrome)    Past  Surgical History: Past Surgical History:  Procedure Laterality Date  . APPENDECTOMY    . COLONOSCOPY N/A 04/08/2014   Procedure: COLONOSCOPY;  Surgeon: Malissa Hippo, MD;  [facility];  Service: Endoscopy;  Laterality: N/A;  135  . ESOPHAGOGASTRODUODENOSCOPY N/A 04/08/2014   Procedure: ESOPHAGOGASTRODUODENOSCOPY (EGD);  Surgeon: Malissa Hippo, MD;  [facility]: [facility];  Service: Endoscopy;  Laterality: N/A;   Medication List:  Current Outpatient Medications  Medication Sig Dispense Refill  . acetaminophen (TYLENOL) 325 MG tablet Take 2 tablets (650 mg total) by mouth every 6 (six) hours as needed for up to 30 doses for mild pain or moderate pain. 30 tablet 0  . ARIPiprazole (ABILIFY) 10 MG tablet aripiprazole 10 mg tablet    . gabapentin (NEURONTIN) 300 MG capsule Take 300 mg by mouth 3 (three) times daily.    Marland Kitchen HYDROcodone-acetaminophen (NORCO/VICODIN) 5-325 MG per tablet Take 1 tablet by mouth 3 (three) times daily as needed for moderate pain.     Marland Kitchen ibuprofen (ADVIL) 600 MG tablet Take 1 tablet (600 mg total) by mouth every 6 (six) hours as needed. 30 tablet 0  . zolpidem (AMBIEN) 10 MG tablet zolpidem 10 mg tablet  TAKE 1 TABLET AS NEEDED BY MOUTH AT BEDTIME.     No current facility-administered medications for this visit.   Allergies: Allergies  Allergen Reactions  . Penicillins Other (See Comments)    Childhood allergy Did it involve swelling of the face/tongue/throat, SOB, or low BP? Unknown Did it involve sudden or severe rash/hives, skin peeling, or  any reaction on the inside of your mouth or nose? Unknown Did you need to seek medical attention at a hospital or doctor's office? Unknown When did it last happen? If all above answers are "NO", may proceed with cephalosporin use.    Social History: Social History   Socioeconomic History  . Marital status: Divorced    Spouse name: Not on file  . Number of children: Not on file  . Years of education:  Not on file  . Highest education level: Not on file  Occupational History  . Not on file  Tobacco Use  . Smoking status: Current Every Day Smoker    Packs/day: 1.00    Years: 35.00    Pack years: 35.00    Types: Cigarettes  . Smokeless tobacco: Never Used  . Tobacco comment: 1 pack a day  Substance and Sexual Activity  . 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 Drug use: Not Currently    Types: Marijuana    Comment: quit 08/2014  . Sexual activity: Not on file  Other Topics Concern  . Not on file  Social History Narrative  . Not on file   Social Determinants of Health   Financial Resource Strain: Not on file  Food Insecurity: Not on file  Transportation Needs: Not on file  Physical Activity: Not on file  Stress: Not on file  Social Connections: Not on file       Family History: No family history on file.       Review of Systems  Constitutional: Negative.   HENT: Negative.   Respiratory: Negative.   Gastrointestinal: Negative.   Genitourinary: Negative.   Musculoskeletal: Positive for back pain.  Skin: Negative for rash.  All other systems reviewed and are negative.  Objective: BP 111/70   Pulse 90   Temp 98.1 F (36.7 C) (Temporal)   Ht 5\' 8"  (1.727 m)   Wt 132 lb (59.9 kg)   SpO2 100%   BMI 20.07 kg/m  Body mass index is 20.07 kg/m. Physical Exam Vitals reviewed.  Constitutional:      Appearance: Normal appearance.  HENT:     Head: Normocephalic.     Nose: Nose normal.  Eyes:     Conjunctiva/sclera: Conjunctivae normal.  Cardiovascular:     Rate and Rhythm: Normal rate and regular rhythm.     Pulses: Normal pulses.     Heart sounds: Normal heart sounds.  Pulmonary:     Effort: Pulmonary effort is normal.     Breath sounds: Normal breath sounds.  Abdominal:     General: Bowel sounds are normal.  Musculoskeletal:      Lumbar back: Tenderness present. Decreased range of motion.  Neurological:     Mental Status: He is alert.    The plan was reviewed with the patient/family, and all questions/concerned were addressed.  It was my pleasure to see Jason Lamb today and participate in his care. Please feel free to contact me with any questions or concerns.  Sincerely,  [name] NP [facility]

## 2021-04-06 ENCOUNTER — Ambulatory Visit (INDEPENDENT_AMBULATORY_CARE_PROVIDER_SITE_OTHER): Payer: 59

## 2021-04-06 ENCOUNTER — Ambulatory Visit (INDEPENDENT_AMBULATORY_CARE_PROVIDER_SITE_OTHER): Payer: 59 | Admitting: Orthopaedic Surgery

## 2021-04-06 ENCOUNTER — Encounter: Payer: Self-pay | Admitting: Orthopaedic Surgery

## 2021-04-06 VITALS — BP 112/75 | HR 93

## 2021-04-06 DIAGNOSIS — M5442 Lumbago with sciatica, left side: Secondary | ICD-10-CM | POA: Diagnosis not present

## 2021-04-06 DIAGNOSIS — M542 Cervicalgia: Secondary | ICD-10-CM

## 2021-04-06 DIAGNOSIS — M5441 Lumbago with sciatica, right side: Secondary | ICD-10-CM

## 2021-04-06 DIAGNOSIS — G8929 Other chronic pain: Secondary | ICD-10-CM

## 2021-04-06 MED ORDER — PREDNISONE 10 MG (21) PO TBPK
ORAL_TABLET | ORAL | 0 refills | Status: DC
Start: 2021-04-06 — End: 2021-11-24

## 2021-04-06 NOTE — Progress Notes (Signed)
Office Visit Note   Patient: Jason Lamb           Date of Birth: 07/16/1961           MRN: [id number] Visit Date: 04/06/2021              Requested by: Daryll Drown, NP [address] [place],  [place] [address] PCP: Daryll Drown, NP   Assessment & Plan: Visit Diagnoses:  1. Chronic bilateral low back pain with bilateral sciatica   2. Neck pain     Plan: Patient like proceed with lumbar MRI scan for his history of chronic low back pain for many years now with significant increased pain over the last several months failed conservative treatment with anti-inflammatories, Neurontin, sleep medication and narcotic medication 90 tablets of Norco 5/325 monthly.  Office follow-up after MRI scan for review.  Follow-Up Instructions: Return in about 1 month (around 05/07/2021).   Orders:  Orders Placed This Encounter  Procedures  . XR Cervical Spine 2 or 3 views  . XR Lumbar Spine 2-3 Views  . MR Lumbar Spine w/o contrast   Meds ordered this encounter  Medications  . predniSONE (STERAPRED UNI-PAK 21 TAB) 10 MG (21) TBPK tablet    Sig: Take 6,5,4,3,2,1 tablets daily with food. One less tablet each day    Dispense:  21 tablet    Refill:  0      Procedures: No procedures performed   Clinical Data: No additional findings.   Subjective: Chief Complaint  Patient presents with  . Lower Back - Pain  . Neck - Pain    HPI 60 year old new patient visit with low back pain with sciatica which has been present for years.  He had previous x-rays 02/05/2019.  No known injury.  Has been taking hydrocodone with [facility] PDMP score 401/341/80.  Currently on hydrocodone 5/3 2590 tablets monthly plus zolpidem at night 30 tablets monthly.  Patient states he has some numbness and tingling in his left arm.  Some pain when he moves his right hip.  He has not had any injections or therapy.  Previous injection in his neck 7 or 8 years ago which did seem to help.  He is continuing to work  and is questing a note stating he was seen today in the office. He has been treated by his PCP with Neurontin, ibuprofen home exercise program without relief more than 2 months. Review of Systems past problems with depression.  All other systems noncontributory to HPI.   Objective: Vital Signs: BP 112/75   Pulse 93   Physical Exam Constitutional:      Appearance: He is well-developed.  HENT:     Head: Normocephalic and atraumatic.  Eyes:     Pupils: Pupils are equal, round, and reactive to light.  Neck:     Thyroid: No thyromegaly.     Trachea: No tracheal deviation.  Cardiovascular:     Rate and Rhythm: Normal rate.  Pulmonary:     Effort: Pulmonary effort is normal.     Breath sounds: No wheezing.  Abdominal:     General: Bowel sounds are normal.     Palpations: Abdomen is soft.  Skin:    General: Skin is warm and dry.     Capillary Refill: Capillary refill takes less than 2 seconds.  Neurological:     Mental Status: He is alert and oriented to person, place, and time.  Psychiatric:  Behavior: Behavior normal.        Thought Content: Thought content normal.        Judgment: Judgment normal.     Ortho Exam positive sciatic notch tenderness on the right positive straight leg raising 70 degrees on the right he is able to heel and toe walk with slight difficulty but no isolated weakness.  No atrophy of the anterior posterior compartment peroneal is strong.  Knee and ankle jerk are intact.  Specialty Comments:  No specialty comments available.  Imaging:AP lateral lumbar spine images are obtained and reviewed this shows 75% narrowing L5-S1 with prominent endplate spurring and sclerosis on each side of the endplates with facet degenerative changes.  Spurring of all other lumbar levels.  Impression: L5-S1 disc degenerative changes.  AP lateral cervical spine x-rays are obtained and reviewed.  This shows accentuated cervical lordosis with multilevel disc degeneration  with prominent anterior spurring disc base narrowing from C2 down to C7.  Uncovertebral and facet degenerative changes with endplate spurring noted.  Impression: Multilevel significant cervical spondylosis without anterolisthesis.   PMFS History: Patient Active Problem List   Diagnosis Date Noted  . Chronic bilateral low back pain with bilateral sciatica 03/16/2021  . Loss of weight 08/20/2014  . H. pylori infection 07/15/2014  . GERD (gastroesophageal reflux disease) 03/10/2014  . Diarrhea 03/10/2014  . Graves disease 03/10/2014  . Depression, recurrent (HCC) 03/10/2014   Past Medical History:  Diagnosis Date  . Anxiety   . Arthritis    neck, lumbar spine  . Back pain   . Bronchitis   . Chronic back pain   . Depression   . GERD (gastroesophageal reflux disease)   . Graves disease   . IBS (irritable bowel syndrome)     History reviewed. No pertinent family history.  Past Surgical History:  Procedure Laterality Date  . APPENDECTOMY    . COLONOSCOPY N/A 04/08/2014   Procedure: COLONOSCOPY;  Surgeon: Malissa Hippo, MD;  [facility];  Service: Endoscopy;  Laterality: N/A;  135  . ESOPHAGOGASTRODUODENOSCOPY N/A 04/08/2014   Procedure: ESOPHAGOGASTRODUODENOSCOPY (EGD);  Surgeon: Malissa Hippo, MD;  [facility];  Service: Endoscopy;  Laterality: N/A;   Social History   Occupational History  . Not on file  Tobacco Use  . Smoking status: Current Every Day Smoker    Packs/day: 1.00    Years: 35.00    Pack years: 35.00    Types: Cigarettes  . Smokeless tobacco: Never Used  . Tobacco comment: 1 pack a day  Substance and Sexual Activity  . 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 Drug use: Not Currently    Types: Marijuana    Comment: quit 08/2014  . Sexual activity: Not on file

## 2021-04-14 ENCOUNTER — Telehealth: Payer: Self-pay | Admitting: Orthopaedic Surgery

## 2021-04-14 NOTE — Telephone Encounter (Signed)
Called left pt 1X vm to call and set MRI review appt with Dr. Ophelia Charter after 04/22/21. Will try again later

## 2021-04-22 ENCOUNTER — Other Ambulatory Visit: Payer: Self-pay

## 2021-04-22 ENCOUNTER — Ambulatory Visit (HOSPITAL_COMMUNITY)
Admission: RE | Admit: 2021-04-22 | Discharge: 2021-04-22 | Disposition: A | Discharge status: Home / Self Care | Payer: 59 | Source: Ambulatory Visit | Attending: Orthopaedic Surgery | Admitting: Orthopaedic Surgery

## 2021-04-22 DIAGNOSIS — M5441 Lumbago with sciatica, right side: Secondary | ICD-10-CM | POA: Insufficient documentation

## 2021-04-22 DIAGNOSIS — M5442 Lumbago with sciatica, left side: Secondary | ICD-10-CM | POA: Diagnosis present

## 2021-04-22 DIAGNOSIS — G8929 Other chronic pain: Secondary | ICD-10-CM | POA: Insufficient documentation

## 2021-04-27 ENCOUNTER — Encounter: Payer: Self-pay | Admitting: Orthopaedic Surgery

## 2021-04-27 ENCOUNTER — Ambulatory Visit (INDEPENDENT_AMBULATORY_CARE_PROVIDER_SITE_OTHER): Payer: 59 | Admitting: Orthopaedic Surgery

## 2021-04-27 VITALS — BP 126/82 | HR 75 | Ht 69.0 in | Wt 130.0 lb

## 2021-04-27 DIAGNOSIS — G8929 Other chronic pain: Secondary | ICD-10-CM | POA: Diagnosis not present

## 2021-04-27 DIAGNOSIS — M5441 Lumbago with sciatica, right side: Secondary | ICD-10-CM

## 2021-04-27 DIAGNOSIS — M5442 Lumbago with sciatica, left side: Secondary | ICD-10-CM | POA: Diagnosis not present

## 2021-04-27 NOTE — Progress Notes (Signed)
Office Visit Note   Patient: Jason Lamb           Date of Birth: November 07, 1961           MRN: [id number] Visit Date: 04/27/2021              Requested by: Daryll Drown, NP [address] [place],  [place] [address] PCP: Daryll Drown, NP   Assessment & Plan: Visit Diagnoses:  1. Chronic bilateral low back pain with bilateral sciatica     Plan: Patient requesting single epidural injection since he got good relief with his neck problem several years ago.  Reviewed MRI scan with patient today given copy of the report.  We discussed pathophysiology.  Patient has single level low significant disc degeneration with endplate spurring and mild subarticular stenosis.  We will set him up for single epidural injection and he can follow-up with me postinjection.  Follow-Up Instructions: No follow-ups on file.   Orders:  No orders of the defined types were placed in this encounter.  No orders of the defined types were placed in this encounter.     Procedures: No procedures performed   Clinical Data: No additional findings.   Subjective: Chief Complaint  Patient presents with  . Lower Back - Pain, Follow-up    MRI lumbar review    HPI 60 year old male returns with chronic low back pain for many years.  Recent increase symptoms for several months.  Been treated with Norco, Neurontin, anti-inflammatories, sleep medication.  Chronic Norco 5/325 90 tablets monthly times multiple years.  Recent significant increase in back pain which is making it difficult for him to work.  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  MRI scan has been obtained and is available for review.  Negative for chills or fever.  Positive back pain that radiates into his buttocks pain with bending and rotation.  Past history of significant neck pain which resolved with epidural injection 7 years ago.  Past history positive for GERD Graves' disease depression.  Review  of Systems negative for fever or chills.  Positive for back pain, chronic with recent increase acute pain.   Objective: Vital Signs: BP 126/82   Pulse 75   Ht 5\' 9"  (1.753 m)   Wt 130 lb (59 kg)   BMI 19.20 kg/m   Physical Exam Constitutional:      Appearance: He is well-developed.  HENT:     Head: Normocephalic and atraumatic.  Eyes:     Pupils: Pupils are equal, round, and reactive to light.  Neck:     Thyroid: No thyromegaly.     Trachea: No tracheal deviation.  Cardiovascular:     Rate and Rhythm: Normal rate.  Pulmonary:     Effort: Pulmonary effort is normal.     Breath sounds: No wheezing.  Abdominal:     General: Bowel sounds are normal.     Palpations: Abdomen is soft.  Skin:    General: Skin is warm and dry.     Capillary Refill: Capillary refill takes less than 2 seconds.  Neurological:     Mental Status: He is alert and oriented to person, place, and time.  Psychiatric:        Behavior: Behavior normal.        Thought Content: Thought content normal.        Judgment: Judgment normal.     Ortho Exam bilateral positive straight leg raising  70 degrees.  He is able to heel and toe walk.  No atrophy.  Anterior tib gastrocsoleus is intact.  Bilateral sciatic notch tenderness.  Increased pain with rotation forward flexion.  Specialty Comments:  No specialty comments available.  Imaging: CLINICAL DATA:  Low back pain, chronic.  No known injury  EXAM: MRI LUMBAR SPINE WITHOUT CONTRAST  TECHNIQUE: Multiplanar, multisequence MR imaging of the lumbar spine was performed. No intravenous contrast was administered.  COMPARISON:  Lumbar spine radiographs 04/06/2021  FINDINGS: Segmentation:  Normal  Alignment:  Normal  Vertebrae:  Negative for fracture or mass.  Conus medullaris and cauda equina: Conus extends to the L2 level. Conus and cauda equina appear normal.  Paraspinal and other soft tissues: Negative for paraspinous mass  or adenopathy.  Disc levels:  L1-2: Negative  L2-3: Mild disc and mild facet degeneration. Negative for disc protrusion or stenosis  L3-4: Mild disc and mild facet degeneration. Negative for disc protrusion or stenosis.  L4-5: Mild disc and mild facet degeneration. Negative for disc protrusion or stenosis  L5-S1: Moderate disc degeneration with disc space narrowing. Degenerative endplate changes with increased fat in the bone marrow around the disc space. There is diffuse endplate spurring. Mild subarticular stenosis bilaterally. No significant neural impingement. Mild facet degeneration.  IMPRESSION: Mild lumbar degenerative changes most prominent L5-S1. No significant neural impingement.   Electronically Signed   By: Marlan Palau M.D.   On: 04/22/2021 14:55   PMFS History: Patient Active Problem List   Diagnosis Date Noted  . Chronic bilateral low back pain with bilateral sciatica 03/16/2021  . Loss of weight 08/20/2014  . H. pylori infection 07/15/2014  . GERD (gastroesophageal reflux disease) 03/10/2014  . Diarrhea 03/10/2014  . Graves disease 03/10/2014  . Depression, recurrent (HCC) 03/10/2014   Past Medical History:  Diagnosis Date  . Anxiety   . Arthritis    neck, lumbar spine  . Back pain   . Bronchitis   . Chronic back pain   . Depression   . GERD (gastroesophageal reflux disease)   . Graves disease   . IBS (irritable bowel syndrome)     No family history on file.  Past Surgical History:  Procedure Laterality Date  . APPENDECTOMY    . COLONOSCOPY N/A 04/08/2014   Procedure: COLONOSCOPY;  Surgeon: Malissa Hippo, MD;  [facility];  Service: Endoscopy;  Laterality: N/A;  135  . ESOPHAGOGASTRODUODENOSCOPY N/A 04/08/2014   Procedure: ESOPHAGOGASTRODUODENOSCOPY (EGD);  Surgeon: Malissa Hippo, MD;  [facility];  Service: Endoscopy;  Laterality: N/A;   Social History   Occupational History  . Not on file   Tobacco Use  . Smoking status: Current Every Day Smoker    Packs/day: 1.00    Years: 35.00    Pack years: 35.00    Types: Cigarettes  . Smokeless tobacco: Never Used  . Tobacco comment: 1 pack a day  Substance and Sexual Activity  . 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 Drug use: Not Currently    Types: Marijuana    Comment: quit 08/2014  . Sexual activity: Not on file

## 2021-04-27 NOTE — Addendum Note (Signed)
Addended by: Rogers Seeds on: 04/27/2021 09:04 AM   Modules accepted: Orders

## 2021-05-12 ENCOUNTER — Other Ambulatory Visit: Payer: Self-pay | Admitting: *Deleted

## 2021-05-12 DIAGNOSIS — F339 Major depressive disorder, recurrent, unspecified: Secondary | ICD-10-CM

## 2021-05-12 MED ORDER — DULOXETINE HCL 60 MG PO CPEP: 60.0000 mg | ORAL_CAPSULE | Freq: Every day | ORAL | 1 refills | Status: DC

## 2021-05-16 ENCOUNTER — Ambulatory Visit: Payer: Self-pay

## 2021-05-16 ENCOUNTER — Ambulatory Visit (INDEPENDENT_AMBULATORY_CARE_PROVIDER_SITE_OTHER): Payer: 59 | Admitting: Physical Medicine and Rehabilitation

## 2021-05-16 ENCOUNTER — Encounter: Payer: Self-pay | Admitting: Physical Medicine and Rehabilitation

## 2021-05-16 ENCOUNTER — Other Ambulatory Visit: Payer: Self-pay

## 2021-05-16 VITALS — BP 138/86 | HR 79

## 2021-05-16 DIAGNOSIS — M5416 Radiculopathy, lumbar region: Secondary | ICD-10-CM | POA: Diagnosis not present

## 2021-05-16 MED ORDER — BETAMETHASONE SOD PHOS & ACET 6 (3-3) MG/ML IJ SUSP
12.0000 mg | Freq: Once | INTRAMUSCULAR | Status: AC
  Administered 2021-05-16: 12 mg

## 2021-05-16 NOTE — Progress Notes (Signed)
Jason Lamb - 60 y.o. male MRN [id number]  Date of birth: 08-Aug-1961  Office Visit Note: Visit Date: 05/16/2021 PCP: Daryll Drown, NP Referred by: Daryll Drown, NP  Subjective: Chief Complaint  Patient presents with   Lower Back - Pain   Left Leg - Pain, Numbness, Tingling   Right Leg - Numbness, Tingling, Pain   Right Foot - Numbness, Tingling, Pain   Left Foot - Pain, Numbness, Tingling   HPI:  Jason Lamb is a 60 y.o. male who comes in today at the request of Dr. Annell Greening for planned Left L5-S1 Lumbar Interlaminar epidural steroid injection with fluoroscopic guidance.  The patient has failed conservative care including home exercise, medications, time and activity modification.  This injection will be diagnostic and hopefully therapeutic.  Please see requesting physician notes for further details and justification. MRI reviewed with images and spine model.  MRI reviewed in the note below.     ROS Otherwise per HPI.  Assessment & Plan: Visit Diagnoses:    ICD-10-CM   1. Lumbar radiculopathy  M54.16 XR C-ARM NO REPORT    Epidural Steroid injection    betamethasone acetate-betamethasone sodium phosphate (CELESTONE) injection 12 mg      Plan: No additional findings.   Meds & Orders:  Meds ordered this encounter  Medications   betamethasone acetate-betamethasone sodium phosphate (CELESTONE) injection 12 mg    Orders Placed This Encounter  Procedures   XR C-ARM NO REPORT   Epidural Steroid injection    Follow-up: No follow-ups on file.   Procedures: No procedures performed  Lumbar Epidural Steroid Injection - Interlaminar Approach with Fluoroscopic Guidance  Patient: Jason Lamb      Date of Birth: [birth year] MRN: [id number] PCP: Daryll Drown, NP      Visit Date: 05/16/2021   Universal Protocol:     Consent Given By: the patient  Position: PRONE  Additional Comments: Vital signs were monitored before and after the procedure. Patient was  prepped and draped in the usual sterile fashion. The correct patient, procedure, and site was verified.   Injection Procedure Details:   Procedure diagnoses: Lumbar radiculopathy [M54.16]   Meds Administered: No orders of the defined types were placed in this encounter.    Laterality: Left  Location/Site:  L5-S1  Needle: 3.5 in., 20 ga. Tuohy  Needle Placement: Paramedian epidural  Findings:   -Comments: Excellent flow of contrast into the epidural space.  Procedure Details: Using a paramedian approach from the side mentioned above, the region overlying the inferior lamina was localized under fluoroscopic visualization and the soft tissues overlying this structure were infiltrated with 4 ml. of 1% Lidocaine without Epinephrine. The Tuohy needle was inserted into the epidural space using a paramedian approach.   The epidural space was localized using loss of resistance along with counter oblique bi-planar fluoroscopic views.  After negative aspirate for air, blood, and CSF, a 2 ml. volume of Isovue-250 was injected into the epidural space and the flow of contrast was observed. Radiographs were obtained for documentation purposes.    The injectate was administered into the level noted above.   Additional Comments:  The patient tolerated the procedure well Dressing: 2 x 2 sterile gauze and Band-Aid    Post-procedure details: Patient was observed during the procedure. Post-procedure instructions were reviewed.  Patient left the clinic in stable condition.   Clinical History: MRI LUMBAR SPINE WITHOUT CONTRAST   TECHNIQUE: Multiplanar, multisequence MR imaging of the lumbar  spine was performed. No intravenous contrast was administered.   COMPARISON:  Lumbar spine radiographs 04/06/2021   FINDINGS: Segmentation:  Normal   Alignment:  Normal   Vertebrae:  Negative for fracture or mass.   Conus medullaris and cauda equina: Conus extends to the L2 level. Conus and  cauda equina appear normal.   Paraspinal and other soft tissues: Negative for paraspinous mass or adenopathy.   Disc levels:   L1-2: Negative   L2-3: Mild disc and mild facet degeneration. Negative for disc protrusion or stenosis   L3-4: Mild disc and mild facet degeneration. Negative for disc protrusion or stenosis.   L4-5: Mild disc and mild facet degeneration. Negative for disc protrusion or stenosis   L5-S1: Moderate disc degeneration with disc space narrowing. Degenerative endplate changes with increased fat in the bone marrow around the disc space. There is diffuse endplate spurring. Mild subarticular stenosis bilaterally. No significant neural impingement. Mild facet degeneration.   IMPRESSION: Mild lumbar degenerative changes most prominent L5-S1. No significant neural impingement.     Electronically Signed   By: Marlan Palau M.D.   On: 04/22/2021 14:55 ------ TECHNIQUE: MRI SPINE CERVICAL WO IV CONTRAST. 08/30/2017  FINDINGS: # Osseous structures: Vertebral body heights are maintained. No acute fracture. No destructive bony changes. # Alignment: Minimal retrolisthesis C3-4 and C5-6. # Cervicomedullary junction: Normal. No cerebellar tonsillar ectopia. # Spinal cord: No intrinsic spinal cord abnormality.  # C2-C3: No focal disc herniation, significant spinal canal or foraminal stenosis. # C3-C4: Moderate loss of disc height. Anterior spurring. Minimal retrolisthesis, broad-based posterior disc osteophyte complex and thickening of the ligamentum flavum result in moderate spinal canal stenosis and mild cord compression. Bilateral  uncovertebral spurring. Severe bilateral foraminal stenosis. # C4-C5: Moderate loss of disc height. Anterior spurring. Broad-based posterior disc osteophyte complex and thickening of the ligamentum flavum results in moderate spinal canal stenosis and mild cord compression. Bilateral uncovertebral spurring. Severe bilateral foraminal  stenosis. # C5-C6: Severe loss of disc height. Anterior spurring. Minimal retrolisthesis, broad-based posterior disc osteophyte complex and thickening of the ligamentum flavum result in moderate spinal canal stenosis and mild cord compression. Bilateral  uncovertebral spurring. Severe bilateral foraminal stenosis. # C6-C7: Moderate loss of disc height. Broad-based posterior disc osteophyte complex results in mild to moderate spinal canal stenosis without definite impingement of the spinal cord. Bilateral uncovertebral spurring. Severe bilateral foraminal  stenosis. # C7-T1: Mild loss of disc height. Small shallow disc bulge. No significant spinal canal stenosis. No impingement of the spinal cord. Uncovertebral spurring and left facet arthrosis. Moderate right and severe left foraminal stenosis.  # Paraspinal tissues: Unremarkable  # Postcontrast images: None given.  # Additional comments: None.   IMPRESSION: 1. Multilevel DDD and facet arthrosis. Disc osteophyte complexes and some thickening of the ligamentum flavum result in moderate spinal canal stenosis and mild cord compression C3-L4 through C5-6. There is mild to moderate spinal canal stenosis without  definite cord impingement at C6-7. Prominent bilateral foraminal stenosis C3-4 through C7-T1. 2. No acute bony abnormality. 3. No intrinsic spinal cord abnormality.  Electronically Signed by: Angie Fava     Objective:  VS:  HT:    WT:   BMI:     BP:138/86  HR:79bpm  TEMP: ( )  RESP:  Physical Exam Vitals and nursing note reviewed.  Constitutional:      General: He is not in acute distress.    Appearance: Normal appearance. He is not ill-appearing.  HENT:     Head: Normocephalic  and atraumatic.     Right Ear: External ear normal.     Left Ear: External ear normal.     Nose: No congestion.  Eyes:     Extraocular Movements: Extraocular movements intact.  Cardiovascular:     Rate and Rhythm: Normal rate.     Pulses:  Normal pulses.  Pulmonary:     Effort: Pulmonary effort is normal. No respiratory distress.  Abdominal:     General: There is no distension.     Palpations: Abdomen is soft.  Musculoskeletal:        General: No tenderness or signs of injury.     Cervical back: Neck supple.     Right lower leg: No edema.     Left lower leg: No edema.     Comments: Patient has good distal strength without clonus.  Skin:    Findings: No erythema or rash.  Neurological:     General: No focal deficit present.     Mental Status: He is alert and oriented to person, place, and time.     Sensory: No sensory deficit.     Motor: No weakness or abnormal muscle tone.     Coordination: Coordination normal.  Psychiatric:        Mood and Affect: Mood normal.        Behavior: Behavior normal.     Imaging: No results found.

## 2021-05-16 NOTE — Progress Notes (Signed)
Pt state lower back pain that travels down both legs to the foot. Pt state he feeling numbness and tingling down his legs. Pt state walking, standing, sitting and laying down makes the pain worse. Pt state he takes pain meds to help ease is pain.  Numeric Pain Rating Scale and Functional Assessment Average Pain 8   In the last MONTH (on 0-10 scale) has pain interfered with the following?  1. General activity like being  able to carry out your everyday physical activities such as walking, climbing stairs, carrying groceries, or moving a chair?  Rating(10)   +Driver, -BT, -Dye Allergies.

## 2021-05-16 NOTE — Patient Instructions (Signed)

## 2021-05-16 NOTE — Procedures (Signed)
Lumbar Epidural Steroid Injection - Interlaminar Approach with Fluoroscopic Guidance  Patient: Jason Lamb      Date of Birth: [birth year] MRN: [id number] PCP: Daryll Drown, NP      Visit Date: 05/16/2021   Universal Protocol:     Consent Given By: the patient  Position: PRONE  Additional Comments: Vital signs were monitored before and after the procedure. Patient was prepped and draped in the usual sterile fashion. The correct patient, procedure, and site was verified.   Injection Procedure Details:   Procedure diagnoses: Lumbar radiculopathy [M54.16]   Meds Administered: No orders of the defined types were placed in this encounter.    Laterality: Left  Location/Site:  L5-S1  Needle: 3.5 in., 20 ga. Tuohy  Needle Placement: Paramedian epidural  Findings:   -Comments: Excellent flow of contrast into the epidural space.  Procedure Details: Using a paramedian approach from the side mentioned above, the region overlying the inferior lamina was localized under fluoroscopic visualization and the soft tissues overlying this structure were infiltrated with 4 ml. of 1% Lidocaine without Epinephrine. The Tuohy needle was inserted into the epidural space using a paramedian approach.   The epidural space was localized using loss of resistance along with counter oblique bi-planar fluoroscopic views.  After negative aspirate for air, blood, and CSF, a 2 ml. volume of Isovue-250 was injected into the epidural space and the flow of contrast was observed. Radiographs were obtained for documentation purposes.    The injectate was administered into the level noted above.   Additional Comments:  The patient tolerated the procedure well Dressing: 2 x 2 sterile gauze and Band-Aid    Post-procedure details: Patient was observed during the procedure. Post-procedure instructions were reviewed.  Patient left the clinic in stable condition.

## 2021-09-06 ENCOUNTER — Other Ambulatory Visit: Payer: Self-pay | Admitting: Nurse Practitioner

## 2021-09-06 DIAGNOSIS — F339 Major depressive disorder, recurrent, unspecified: Secondary | ICD-10-CM

## 2021-09-19 ENCOUNTER — Other Ambulatory Visit: Payer: Self-pay | Admitting: Nurse Practitioner

## 2021-09-19 DIAGNOSIS — F339 Major depressive disorder, recurrent, unspecified: Secondary | ICD-10-CM

## 2021-11-09 ENCOUNTER — Other Ambulatory Visit: Payer: Self-pay | Admitting: Nurse Practitioner

## 2021-11-09 DIAGNOSIS — F339 Major depressive disorder, recurrent, unspecified: Secondary | ICD-10-CM

## 2021-11-24 ENCOUNTER — Ambulatory Visit: Payer: 59 | Admitting: Family Medicine

## 2021-11-24 ENCOUNTER — Other Ambulatory Visit: Payer: Self-pay

## 2021-11-24 ENCOUNTER — Encounter: Payer: Self-pay | Admitting: Family Medicine

## 2021-11-24 VITALS — BP 114/82 | HR 100 | Temp 97.6°F | Resp 20 | Ht 69.0 in | Wt 122.0 lb

## 2021-11-24 DIAGNOSIS — J0100 Acute maxillary sinusitis, unspecified: Secondary | ICD-10-CM

## 2021-11-24 MED ORDER — PSEUDOEPHEDRINE-GUAIFENESIN ER 120-1200 MG PO TB12
1.0000 | ORAL_TABLET | Freq: Two times a day (BID) | ORAL | 0 refills | Status: DC
Start: 2021-11-24 — End: 2021-12-02

## 2021-11-24 MED ORDER — SULFAMETHOXAZOLE-TRIMETHOPRIM 800-160 MG PO TABS
1.0000 | ORAL_TABLET | Freq: Two times a day (BID) | ORAL | 0 refills | Status: DC
Start: 2021-11-24 — End: 2021-12-26

## 2021-11-24 NOTE — Progress Notes (Signed)
Chief Complaint  Patient presents with   Nasal Congestion    Head congestion, slight cough, ears stopped up     HPI  Patient presents today for Patient presents with upper respiratory congestion. Ears stopped up. Sounds like he is talking in a barrel to him. There is moderate sore throat. Patient reports slight cough as well.  No sputum noted. There is no fever, chills or sweats. The patient denies being short of breath. Onset was 2 weeks ago. Gradually worsening. Tried OTCs without improvement.  PMH: Smoking status noted ROS: Per HPI  Objective: BP 114/82    Pulse 100    Temp 97.6 F (36.4 C)    Resp 20    Ht 5\' 9"  (1.753 m)    Wt 122 lb (55.3 kg)    SpO2 98%    BMI 18.02 kg/m  Gen: NAD, alert, cooperative with exam HEENT: NCAT, Nasal passages swollen, red TMS RED CV: RRR, good S1/S2, no murmur Resp: Bronchitis changes with scattered wheezes, non-labored Ext: No edema, warm Neuro: Alert and oriented, No gross deficits  Assessment and plan:  1. Acute maxillary sinusitis, recurrence not specified     Meds ordered this encounter  Medications   sulfamethoxazole-trimethoprim (BACTRIM DS) 800-160 MG tablet    Sig: Take 1 tablet by mouth 2 (two) times daily. Until gone, for infection    Dispense:  20 tablet    Refill:  0   Pseudoephedrine-Guaifenesin [phone] MG TB12    Sig: Take 1 tablet by mouth 2 (two) times daily. For congestion    Dispense:  12 tablet    Refill:  0    No orders of the defined types were placed in this encounter.   Follow up as needed.  , MD

## 2021-12-02 ENCOUNTER — Encounter: Payer: Self-pay | Admitting: Nurse Practitioner

## 2021-12-02 ENCOUNTER — Ambulatory Visit (INDEPENDENT_AMBULATORY_CARE_PROVIDER_SITE_OTHER): Payer: Self-pay | Admitting: Nurse Practitioner

## 2021-12-02 VITALS — BP 102/65 | HR 104 | Temp 97.8°F | Ht 69.0 in | Wt 129.0 lb

## 2021-12-02 DIAGNOSIS — Z720 Tobacco use: Secondary | ICD-10-CM

## 2021-12-02 DIAGNOSIS — R569 Unspecified convulsions: Secondary | ICD-10-CM

## 2021-12-02 DIAGNOSIS — F339 Major depressive disorder, recurrent, unspecified: Secondary | ICD-10-CM

## 2021-12-02 DIAGNOSIS — F419 Anxiety disorder, unspecified: Secondary | ICD-10-CM

## 2021-12-02 NOTE — Progress Notes (Signed)
Established Patient Office Visit  Subjective:  Patient ID: Jason Lamb, male    DOB: 02/13/1961  Age: 61 y.o. MRN: [id number]  CC:  Chief Complaint  Patient presents with   Depression   Anxiety    HPI Jason Lamb presents for Depression, Follow-up  He  was last seen for this 9 months ago. Changes made at last visit include Cymbalta 60 mg tablet daily.   He reports fair compliance with treatment. He is not having side effects.   He reports fair tolerance of treatment. Current symptoms include: depressed mood, difficulty concentrating, hopelessness, insomnia, and psychomotor agitation He feels he is Worse since last visit.  Depression screen [facility] 2/9 12/02/2021 11/24/2021 03/16/2021  Decreased Interest 1 2 3   Down, Depressed, Hopeless 3 3 3   PHQ - 2 Score 4 5 6   Altered sleeping 3 1 1   Tired, decreased energy 3 2 2   Change in appetite 0 0 0  Feeling bad or failure about yourself  2 1 1   Trouble concentrating 1 1 0  Moving slowly or fidgety/restless 3 1 0  Suicidal thoughts 0 1 0  PHQ-9 Score 16 12 10   Difficult doing work/chores Very difficult Somewhat difficult Somewhat difficult     Anxiety, Follow-up  He was last seen for anxiety 9 months ago. Changes made at last visit include Cymbalta 60 mg tablet daily.   He reports fair compliance with treatment. He reports fair tolerance of treatment. He is not having side effects.   He feels his anxiety is severe and Worse since last visit.  Symptoms: No chest pain Yes difficulty concentrating  No dizziness No fatigue  Yes feelings of losing control Yes insomnia  Yes irritable No palpitations  No panic attacks No racing thoughts  No shortness of breath No sweating  Yes tremors/shakes    GAD-7 Results GAD-7 Generalized Anxiety Disorder Screening Tool 12/02/2021  1. Feeling Nervous, Anxious, or on Edge 3  2. Not Being Able to Stop or Control Worrying 3  3. Worrying Too Much About Different Things 3  4. Trouble  Relaxing 3  5. Being So Restless it's Hard To Sit Still 3  6. Becoming Easily Annoyed or Irritable 2  7. Feeling Afraid As If Something Awful Might Happen 3  Total GAD-7 Score 20  Difficulty At Work, Home, or Getting  Along With Others? Very difficult    PHQ-9 Scores PHQ9 SCORE ONLY 12/02/2021 11/24/2021 03/16/2021  PHQ-9 Total Score 16 12 10     Smoking cessation instruction/counseling given:  counseled patient on the dangers of tobacco use, advised patient to stop smoking, and reviewed strategies to maximize success   Past Medical History:  Diagnosis Date   Anxiety    Arthritis    neck, lumbar spine   Back pain    Bronchitis    Chronic back pain    Depression    GERD (gastroesophageal reflux disease)    Graves disease    IBS (irritable bowel syndrome)     Past Surgical History:  Procedure Laterality Date   APPENDECTOMY     COLONOSCOPY N/A 04/08/2014   Procedure: COLONOSCOPY;  Surgeon: , MD;  Location: [facility];  Service: Endoscopy;  Laterality: N/A;  135   ESOPHAGOGASTRODUODENOSCOPY N/A 04/08/2014   Procedure: ESOPHAGOGASTRODUODENOSCOPY (EGD);  Surgeon: 01/30/2022, MD;  Location: [facility];  Service: Endoscopy;  Laterality: N/A;    History reviewed. No pertinent family history.  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, sober since 2014   Drug use: Not Currently    Types: Marijuana     Comment: quit 08/2014   Sexual activity: Not on file  Other Topics Concern   Not on file  Social History Narrative   Not on file   Social Determinants of Health   Financial Resource Strain: Not on file  Food Insecurity: Not on file  Transportation Needs: Not on file  Physical Activity: Not on file  Stress: Not on file  Social Connections: Not on file  Intimate Partner Violence: Not on file    Outpatient Medications Prior to Visit  Medication Sig Dispense Refill   DULoxetine (CYMBALTA) 30 MG capsule duloxetine 30 mg capsule,delayed release  Take 1 capsule every day by oral route in the morning.     gabapentin (NEURONTIN) 300 MG capsule Take 300 mg by mouth 3 (three) times daily.     HYDROcodone-acetaminophen (NORCO/VICODIN) 5-325 MG per tablet Take 1 tablet by mouth 3 (three) times daily as needed for moderate pain.      meloxicam (MOBIC) 15 MG tablet meloxicam 15 mg tablet  Take 1 tablet every day by oral route.     sulfamethoxazole-trimethoprim (BACTRIM DS) 800-160 MG tablet Take 1 tablet by mouth 2 (two) times daily. Until gone, for infection 20 tablet 0   acetaminophen (TYLENOL) 325 MG tablet Take 2 tablets (650 mg total) by mouth every 6 (six) hours as needed for up to 30 doses for mild pain or moderate pain. 30 tablet 0   ARIPiprazole (ABILIFY) 10 MG tablet      DULoxetine (CYMBALTA) 60 MG capsule Take 1 capsule (60 mg total) by mouth daily. (NEEDS TO BE SEEN BEFORE NEXT REFILL) 60 capsule 0   hydrOXYzine (ATARAX/VISTARIL) 25 MG tablet Take 25 mg by mouth 2 (two) times daily as needed. (Patient not taking: Reported on 11/24/2021)     ibuprofen (ADVIL) 600 MG tablet Take 1 tablet (600 mg total) by mouth every 6 (six) hours as needed. (Patient not taking: Reported on 11/24/2021) 30 tablet 0   Pseudoephedrine-Guaifenesin [phone] MG TB12 Take 1 tablet by mouth 2 (two) times daily. For congestion 12 tablet 0   No facility-administered medications prior to visit.    Allergies   Allergen Reactions   Penicillins Other (See Comments)    Childhood allergy Did it involve swelling of the face/tongue/throat, SOB, or low BP? Unknown Did it involve sudden or severe rash/hives, skin peeling, or any reaction on the inside of your mouth or nose? Unknown Did you need to seek medical attention at a hospital or doctor's office? Unknown When did it last happen?       If all above answers are NO, may proceed with cephalosporin use.     ROS  Review of Systems  Constitutional: Negative.   HENT: Negative.    Respiratory: Negative.    Cardiovascular: Negative.   Gastrointestinal: Negative.   Neurological:  Positive for tremors.  Psychiatric/Behavioral:  The patient is nervous/anxious.   All other systems reviewed and are negative.    Objective:    Physical Exam Vitals and nursing note reviewed.  Constitutional:      Appearance: Normal appearance. He is ill-appearing.  HENT:     Head: Normocephalic.     Mouth/Throat:     Mouth: Mucous membranes are moist.     Pharynx: Oropharynx is clear.  Eyes:     Conjunctiva/sclera: Conjunctivae normal.  Cardiovascular:     Rate and Rhythm: Normal rate and regular rhythm.     Pulses: Normal pulses.     Heart sounds: Normal heart sounds.  Pulmonary:     Effort: Pulmonary effort is normal.     Breath sounds: Normal breath sounds.  Abdominal:     General: Bowel sounds are normal.  Skin:    General: Skin is dry.     Findings: No rash.  Neurological:     Mental Status: He is oriented to person, place, and time.  Psychiatric:        Attention and Perception: Attention and perception normal.        Mood and Affect: Mood is anxious and depressed.        Speech: Speech normal.        Behavior: Behavior is cooperative.        Thought Content: Thought content normal.        Judgment: Judgment normal.    BP 102/65    Pulse (!) 104    Temp 97.8 F (36.6 C) (Temporal)    Ht 5\' 9"  (1.753 m)    Wt 129 lb (58.5 kg)    BMI 19.05  kg/m  Wt Readings from Last 3 Encounters:  12/02/21 129 lb (58.5 kg)  11/24/21 122 lb (55.3 kg)  04/27/21 130 lb (59 kg)     Health Maintenance Due  Topic Date Due   COVID-19 Vaccine (1) Never done   Pneumococcal Vaccine 26-81 Years old (1 - PCV) Never done   HIV Screening  Never done   Zoster Vaccines- Shingrix (1 of 2) Never done   COLONOSCOPY (Pts 45-21yrs Insurance coverage will need to be confirmed)  04/09/2019   INFLUENZA VACCINE  Never done    There are no preventive care reminders to display for this patient.  No results found for: TSH Lab Results  Component Value Date   WBC 9.4 01/03/2019   HGB 14.4 01/03/2019   HCT 44.0 01/03/2019   MCV 87.3 01/03/2019   PLT 260 01/03/2019   Lab Results  Component Value Date   NA 136 01/03/2019   K 3.7 01/03/2019   CO2 25 01/03/2019   GLUCOSE 92 01/03/2019   BUN 6 01/03/2019   CREATININE 0.79 01/03/2019   BILITOT 0.7 01/03/2019   ALKPHOS 84 01/03/2019   AST 17 01/03/2019   ALT 11 01/03/2019   PROT 7.0 01/03/2019   ALBUMIN 4.4 01/03/2019   CALCIUM 8.5 (L) 01/03/2019   ANIONGAP 10 01/03/2019       Assessment & Plan:   Problem List Items Addressed This Visit       Other   Depression, recurrent (HCC) - Primary    Patient's symptoms not well controlled on current medication, patient was seen by neurologist and changed medication dose from Cymbalta 60  mg tablet to 30 mg tablet, patient reports having tremors and could not keep still. Patient is currently taking 30 mg and is not well controlled. Advised patient to return to Neurology for further assessment as family member with patient is reporting seizure like activity with patient at home, all neuro checks completed patient is WNL. referral to neurology completed. Patient will follow up with psychiatry.  Education provided, patient verbalize understanding.           Relevant Medications   DULoxetine (CYMBALTA) 30 MG capsule   Other Relevant Orders    Ambulatory referral to Psychiatry   Anxiety    Anxiety not well controlled, referral to psychiatry completed, completed GAD-7 and PHQ 9., continue current dose of Cymbalta 30 mg tablet. Follow up in 6 - 12 weeks.       Relevant Medications   DULoxetine (CYMBALTA) 30 MG capsule   Other Relevant Orders   Ambulatory referral to Psychiatry   Tobacco abuse    Patient is not ready to stop smoking at this time. Education provided.      Other Visit Diagnoses     Seizure-like activity [facility](HCC)       Relevant Orders   Ambulatory referral to Neurology         Follow-up: No follow-ups on file.    Daryll Drownnyeje M Jaceion Aday, NP

## 2021-12-02 NOTE — Patient Instructions (Signed)
Smoking Tobacco Information, Adult Smoking tobacco can be harmful to your health. Tobacco contains a poisonous (toxic), colorless chemical called nicotine. Nicotine is addictive. It changes the brain and can make it hard to stop smoking. Tobacco also has other toxic chemicals that can hurt your body and raise your risk of many cancers. How can smoking tobacco affect me? Smoking tobacco puts you at risk for: Cancer. Smoking is most commonly associated with lung cancer, but can also lead to cancer in other parts of the body. Chronic obstructive pulmonary disease (COPD). This is a long-term lung condition that makes it hard to breathe. It also gets worse over time. High blood pressure (hypertension), heart disease, stroke, or heart attack. Lung infections, such as pneumonia. Cataracts. This is when the lenses in the eyes become clouded. Digestive problems. This may include peptic ulcers, heartburn, and gastroesophageal reflux disease (GERD). Oral health problems, such as gum disease and tooth loss. Loss of taste and smell. Smoking can affect your appearance by causing: Wrinkles. Yellow or stained teeth, fingers, and fingernails. Smoking tobacco can also affect your social life, because: It may be challenging to find places to smoke when away from home. Many workplaces, [employer], hotels, and public places are tobacco-free. Smoking is expensive. This is due to the cost of tobacco and the long-term costs of treating health problems from smoking. Secondhand smoke may affect those around you. Secondhand smoke can cause lung cancer, breathing problems, and heart disease. Children of smokers have a higher risk for: Sudden infant death syndrome (SIDS). Ear infections. Lung infections. If you currently smoke tobacco, quitting now can help you: Lead a longer and healthier life. Look, smell, breathe, and feel better over time. Save money. Protect others from the harms of secondhand smoke. What  actions can I take to prevent health problems? Quit smoking  Do not start smoking. Quit if you already do. Make a plan to quit smoking and commit to it. Look for programs to help you and ask your health care provider for recommendations and ideas. Set a date and write down all the reasons you want to quit. Let your friends and family know you are quitting so they can help and support you. Consider finding friends who also want to quit. It can be easier to quit with someone else, so that you can support each other. Talk with your health care provider about using nicotine replacement medicines to help you quit, such as gum, lozenges, patches, sprays, or pills. Do not replace cigarette smoking with electronic cigarettes, which are commonly called e-cigarettes. The safety of e-cigarettes is not known, and some may contain harmful chemicals. If you try to quit but return to smoking, stay positive. It is common to slip up when you first quit, so take it one day at a time. Be prepared for cravings. When you feel the urge to smoke, chew gum or suck on hard candy. Lifestyle Stay busy and take care of your body. Drink enough fluid to keep your urine pale yellow. Get plenty of exercise and eat a healthy diet. This can help prevent weight gain after quitting. Monitor your eating habits. Quitting smoking can cause you to have a larger appetite than when you smoke. Find ways to relax. Go out with friends or family to a movie or a restaurant where people do not smoke. Ask your health care provider about having regular tests (screenings) to check for cancer. This may include blood tests, imaging tests, and other tests. Find ways to manage  your stress, such as meditation, yoga, or exercise. Where to find support To get support to quit smoking, consider: Asking your health care provider for more information and resources. Taking classes to learn more about quitting smoking. Looking for local organizations that  offer resources about quitting smoking. Joining a support group for people who want to quit smoking in your local community. Calling the smokefree.gov counselor helpline: 1-800-Quit-Now (501) 804-6123) Where to find more information You may find more information about quitting smoking from: HelpGuide.org: www.helpguide.org [employer]: smokefree.gov American Lung Association: www.lung.org Contact a health care provider if you: Have problems breathing. Notice that your lips, nose, or fingers turn blue. Have chest pain. Are coughing up blood. Feel faint or you pass out. Have other health changes that cause you to worry. Summary Smoking tobacco can negatively affect your health, the health of those around you, your finances, and your social life. Do not start smoking. Quit if you already do. If you need help quitting, ask your health care provider. Think about joining a support group for people who want to quit smoking in your local community. There are many effective programs that will help you to quit this behavior. This information is not intended to replace advice given to you by your health care provider. Make sure you discuss any questions you have with your health care provider. Document Revised: 07/18/2021 Document Reviewed: 10/05/2020 Elsevier Patient Education  2022 Else[employer] Inc. Major Depressive Disorder, Adult Major depressive disorder (MDD) is a mental health condition. It may also be called clinical depression or unipolar depression. MDD causes symptoms of sadness, hopelessness, and loss of interest in things. These symptoms last most of the day, almost every day, for 2 weeks. MDD can also cause physical symptoms. It can interfere with relationships and with everyday activities, such as work, school, and activities that are usually pleasant. MDD may be mild, moderate, or severe. It may be single-episode MDD, which happens once, or recurrent MDD, which may occur multiple  times. What are the causes? The exact cause of this condition is not known. MDD is most likely caused by a combination of things, which may include: Your personality traits. Learned or conditioned behaviors or thoughts or feelings that reinforce negativity. Any alcohol or substance misuse. Long-term (chronic) physical or mental health illness. Going through a traumatic experience or major life changes. What increases the risk? The following factors may make someone more likely to develop MDD: A family history of depression. Being a woman. Troubled family relationships. Abnormally low levels of certain brain chemicals. Traumatic or painful events in childhood, especially abuse or loss of a parent. A lot of stress from life experiences, such as poor living conditions or discrimination. Chronic physical illness or other mental health disorders. What are the signs or symptoms? The main symptoms of MDD usually include: Constant depressed or irritable mood. A loss of interest in things and activities. Other symptoms include: Sleeping or eating too much or too little. Unexplained weight gain or weight loss. Tiredness or low energy. Being agitated, restless, or weak. Feeling hopeless, worthless, or guilty. Trouble thinking clearly or making decisions. Thoughts of suicide or thoughts of harming others. Isolating oneself or avoiding other people or activities. Trouble completing tasks, work, or any normal obligations. Severe symptoms of this condition may include: Psychotic depression.This may include false beliefs, or delusions. It may also include seeing, hearing, tasting, smelling, or feeling things that are not real (hallucinations). Chronic depression or persistent depressive disorder. This is low-level depression that lasts  for at least 2 years. Melancholic depression, or feeling extremely sad and hopeless. Catatonic depression, which includes trouble speaking and trouble moving. How  is this diagnosed? This condition may be diagnosed based on: Your symptoms. Your medical and mental health history. You may be asked questions about your lifestyle, including any drug and alcohol use. A physical exam. Blood tests to rule out other conditions. MDD is confirmed if you have the following symptoms most of the day, nearly every day, in a 2-week period: Either a depressed mood or loss of interest. At least four other MDD symptoms. How is this treated? This condition is usually treated by mental health professionals, such as psychologists, psychiatrists, and clinical social workers. You may need more than one type of treatment. Treatment may include: Psychotherapy, also called talk therapy or counseling. Types of psychotherapy include: Cognitive behavioral therapy (CBT). This teaches you to recognize unhealthy feelings, thoughts, and behaviors, and replace them with positive thoughts and actions. Interpersonal therapy (IPT). This helps you to improve the way you communicate with others or relate to them. Family therapy. This treatment includes members of your family. Medicines to treat anxiety and depression. These medicines help to balance the brain chemicals that affect your emotions. Lifestyle changes. You may be asked to: Limit alcohol use and avoid drug use. Get regular exercise. Get plenty of sleep. Make healthy eating choices. Spend more time outdoors. Brain stimulation. This may be done if symptoms are very severe and other treatments have not worked. Examples of this treatment are electroconvulsive therapy and transcranial magnetic stimulation. Follow these instructions at home: Activity Exercise regularly and spend time outdoors. Find activities that you enjoy doing, and make time to do them. Find healthy ways to manage stress, such as: Meditation or deep breathing. Spending time in nature. Journaling. Return to your normal activities as told by your health care  provider. Ask your health care provider what activities are safe for you. Alcohol and drug use If you drink alcohol: Limit how much you use to: 0-1 drink a day for women who are not pregnant. 0-2 drinks a day for men. Be aware of how much alcohol is in your drink. In the [place] one drink equals one 12 oz bottle of beer (355 mL), one 5 oz glass of wine (148 mL), or one 1 oz glass of hard liquor (44 mL). Discuss your alcohol use with your health care provider. Alcohol can affect any antidepressant medicines you are taking. Discuss any drug use with your health care provider. General instructions  Take over-the-counter and prescription medicines only as told by your health care provider. Eat a healthy diet and get plenty of sleep. Consider joining a support group. Your health care provider may be able to recommend one. Keep all follow-up visits as told by your health care provider. This is important. Where to find more information [employer] Mental Illness: www.nami.org [place] [employer] of [employer] Health: http://www.maynard.net/www.nimh.nih.gov Contact a health care provider if: Your symptoms get worse. You develop new symptoms. Get help right away if: You self-harm. You have serious thoughts about hurting yourself or others. You hallucinate. If you ever feel like you may hurt yourself or others, or have thoughts about taking your own life, get help right away. Go to your nearest emergency department or: Call your local emergency services (911 in the [place] Call a suicide crisis helpline, such as the National Suicide Prevention Lifeline at (337)497-98121-(210)603-1135 or 988 in the [place] This is open 24 hours a  day in the [place] Text the Crisis Text Line at 858-710-8716 (in the [place] Summary Major depressive disorder (MDD) is a mental health condition. MDD causes symptoms of sadness, hopelessness, and loss of interest in things. These symptoms last most of the day, almost every day, for 2 weeks. The symptoms of MDD  can interfere with relationships and with everyday activities. Treatments and support are available for people who develop MDD. You may need more than one type of treatment. Get help right away if you have serious thoughts about hurting yourself or others. This information is not intended to replace advice given to you by your health care provider. Make sure you discuss any questions you have with your health care provider. Document Revised: 06/08/2021 Document Reviewed: 10/25/2019 Elsevier Patient Education  2022 Elsevier Inc. Generalized Anxiety Disorder, Adult Generalized anxiety disorder (GAD) is a mental health condition. Unlike normal worries, anxiety related to GAD is not triggered by a specific event. These worries do not fade or get better with time. GAD interferes with relationships, work, and school. GAD symptoms can vary from mild to severe. People with severe GAD can have intense waves of anxiety with physical symptoms that are similar to panic attacks. What are the causes? The exact cause of GAD is not known, but the following are believed to have an impact: Differences in natural brain chemicals. Genes passed down from parents to children. Differences in the way threats are perceived. Development and stress during childhood. Personality. What increases the risk? The following factors may make you more likely to develop this condition: Being male. Having a family history of anxiety disorders. Being very shy. Experiencing very stressful life events, such as the death of a loved one. Having a very stressful family environment. What are the signs or symptoms? People with GAD often worry excessively about many things in their lives, such as their health and family. Symptoms may also include: Mental and emotional symptoms: Worrying excessively about natural disasters. Fear of being late. Difficulty concentrating. Fears that others are judging your performance. Physical  symptoms: Fatigue. Headaches, muscle tension, muscle twitches, trembling, or feeling shaky. Feeling like your heart is pounding or beating very fast. Feeling out of breath or like you cannot take a deep breath. Having trouble falling asleep or staying asleep, or experiencing restlessness. Sweating. Nausea, diarrhea, or irritable bowel syndrome (IBS). Behavioral symptoms: Experiencing erratic moods or irritability. Avoidance of new situations. Avoidance of people. Extreme difficulty making decisions. How is this diagnosed? This condition is diagnosed based on your symptoms and medical history. You will also have a physical exam. Your health care provider may perform tests to rule out other possible causes of your symptoms. To be diagnosed with GAD, a person must have anxiety that: Is out of his or her control. Affects several different aspects of his or her life, such as work and relationships. Causes distress that makes him or her unable to take part in normal activities. Includes at least three symptoms of GAD, such as restlessness, fatigue, trouble concentrating, irritability, muscle tension, or sleep problems. Before your health care provider can confirm a diagnosis of GAD, these symptoms must be present more days than they are not, and they must last for 6 months or longer. How is this treated? This condition may be treated with: Medicine. Antidepressant medicine is usually prescribed for long-term daily control. Anti-anxiety medicines may be added in severe cases, especially when panic attacks occur. Talk therapy (psychotherapy). Certain types of talk therapy can be helpful  in treating GAD by providing support, education, and guidance. Options include: Cognitive behavioral therapy (CBT). People learn coping skills and self-calming techniques to ease their physical symptoms. They learn to identify unrealistic thoughts and behaviors and to replace them with more appropriate thoughts and  behaviors. Acceptance and commitment therapy (ACT). This treatment teaches people how to be mindful as a way to cope with unwanted thoughts and feelings. Biofeedback. This process trains you to manage your body's response (physiological response) through breathing techniques and relaxation methods. You will work with a therapist while machines are used to monitor your physical symptoms. Stress management techniques. These include yoga, meditation, and exercise. A mental health specialist can help determine which treatment is [place] for you. Some people see improvement with one type of therapy. However, other people require a combination of therapies. Follow these instructions at home: Lifestyle Maintain a consistent routine and schedule. Anticipate stressful situations. Create a plan and allow extra time to work with your plan. Practice stress management or self-calming techniques that you have learned from your therapist or your health care provider. Exercise regularly and spend time outdoors. Eat a healthy diet that includes plenty of vegetables, fruits, whole grains, low-fat dairy products, and lean protein. Do not eat a lot of foods that are high in fat, added sugar, or salt (sodium). Drink plenty of water. Avoid alcohol. Alcohol can increase anxiety. Avoid caffeine and certain over-the-counter cold medicines. These may make you feel worse. Ask your pharmacist which medicines to avoid. General instructions Take over-the-counter and prescription medicines only as told by your health care provider. Understand that you are likely to have setbacks. Accept this and be kind to yourself as you persist to take better care of yourself. Anticipate stressful situations. Create a plan and allow extra time to work with your plan. Recognize and accept your accomplishments, even if you judge them as small. Spend time with people who care about you. Keep all follow-up visits. This is important. Where to  find more information [employer]: http://www.maynard.net/ Substance Abuse and Mental Health Services: SkateOasis.com.pt Contact a health care provider if: Your symptoms do not get better. Your symptoms get worse. You have signs of depression, such as: A persistently sad or irritable mood. Loss of enjoyment in activities that used to bring you joy. Change in weight or eating. Changes in sleeping habits. Get help right away if: You have thoughts about hurting yourself or others. If you ever feel like you may hurt yourself or others, or have thoughts about taking your own life, get help right away. Go to your nearest emergency department or: Call your local emergency services (911 in the [place] Call a suicide crisis helpline, such as the National Suicide Prevention Lifeline at 606-148-3207 or 988 in the [place] This is open 24 hours a day in the [place] Text the Crisis Text Line at 714-642-4751 (in the [place] Summary Generalized anxiety disorder (GAD) is a mental health condition that involves worry that is not triggered by a specific event. People with GAD often worry excessively about many things in their lives, such as their health and family. GAD may cause symptoms such as restlessness, trouble concentrating, sleep problems, frequent sweating, nausea, diarrhea, headaches, and trembling or muscle twitching. A mental health specialist can help determine which treatment is [place] for you. Some people see improvement with one type of therapy. However, other people require a combination of therapies. This information is not intended to replace advice given to you by your  health care provider. Make sure you discuss any questions you have with your health care provider. Document Revised: 06/08/2021 Document Reviewed: 03/06/2021 Elsevier Patient Education  2022 [employer]

## 2021-12-03 NOTE — Assessment & Plan Note (Signed)
Patient's symptoms not well controlled on current medication, patient was seen by neurologist and changed medication dose from Cymbalta 60 mg tablet to 30 mg tablet, patient reports having tremors and could not keep still. Patient is currently taking 30 mg and is not well controlled. Advised patient to return to Neurology for further assessment as family member with patient is reporting seizure like activity with patient at home, all neuro checks completed patient is WNL. referral to neurology completed. Patient will follow up with psychiatry.  Education provided, patient verbalize understanding.

## 2021-12-03 NOTE — Assessment & Plan Note (Signed)
Patient is not ready to stop smoking at this time. Education provided.

## 2021-12-03 NOTE — Assessment & Plan Note (Signed)
Anxiety not well controlled, referral to psychiatry completed, completed GAD-7 and PHQ 9., continue current dose of Cymbalta 30 mg tablet. Follow up in 6 - 12 weeks.

## 2021-12-13 ENCOUNTER — Telehealth (HOSPITAL_COMMUNITY): Payer: Self-pay

## 2021-12-26 ENCOUNTER — Ambulatory Visit: Payer: 59 | Admitting: Neurology

## 2021-12-26 ENCOUNTER — Encounter: Payer: Self-pay | Admitting: Neurology

## 2021-12-26 ENCOUNTER — Other Ambulatory Visit: Payer: Self-pay | Admitting: Neurology

## 2021-12-26 VITALS — BP 110/71 | HR 91 | Ht 69.0 in | Wt 135.0 lb

## 2021-12-26 DIAGNOSIS — R682 Dry mouth, unspecified: Secondary | ICD-10-CM | POA: Diagnosis not present

## 2021-12-26 DIAGNOSIS — G40909 Epilepsy, unspecified, not intractable, without status epilepticus: Secondary | ICD-10-CM | POA: Diagnosis not present
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
## 2021-12-26 NOTE — Patient Instructions (Addendum)
Continue with Gabapentin 300 mg TID  Routine EEG, will contact you to go over results.  Follow up with PCP Follow up in 1 year or sooner if worse

## 2021-12-29 NOTE — Telephone Encounter (Signed)
Patient was called to conduct a phone interview for TMS therapy. Due to seizure like activity patient is not a candidate for TMS.

## 2022-01-02 ENCOUNTER — Ambulatory Visit: Payer: 59 | Admitting: Neurology

## 2022-01-02 DIAGNOSIS — G40909 Epilepsy, unspecified, not intractable, without status epilepticus: Secondary | ICD-10-CM | POA: Diagnosis not present

## 2022-01-02 NOTE — Procedures (Signed)
° ° °  History:  44 yom with seizure  EEG classification: Awake and drowsy  Description of the recording: The background rhythms of this recording consists of a fairly well modulated medium amplitude alpha rhythm of 10 Hz that is reactive to eye opening and closure. As the record progresses, the patient appears to remain in the waking state throughout the recording. Photic stimulation was performed, did not show any abnormalities. Hyperventilation was also performed, did not show any abnormalities. Toward the end of the recording, the patient enters the drowsy state with slight symmetric slowing seen. The patient never enters stage II sleep. No abnormal epileptiform discharges seen during this recording. There was no focal slowing. EKG monitor shows no evidence of cardiac rhythm abnormalities with a heart rate of 78.  Impression: This is a normal EEG recording in the waking and drowsy state. No evidence of interictal epileptiform discharges seen. A normal EEG does not exclude a diagnosis of epilepsy.    Windell Norfolk, MD [facility]

## 2022-04-10 IMAGING — MR MR LUMBAR SPINE W/O CM
5 series · 32 of 48 positions shown · non-contrast
Comparison: Lumbar spine radiographs 04/06/2021

CLINICAL DATA: Low back pain, chronic.  No known injury

EXAM:
MRI LUMBAR SPINE WITHOUT CONTRAST
TECHNIQUE: Multiplanar, multisequence MR imaging of the lumbar spine was
performed. No intravenous contrast was administered.

[Series 5: T2 · sagittal · 4.0mm · 0.68mm/px · 7 of 15 slices shown (1 of 2)]
[im 1/15]
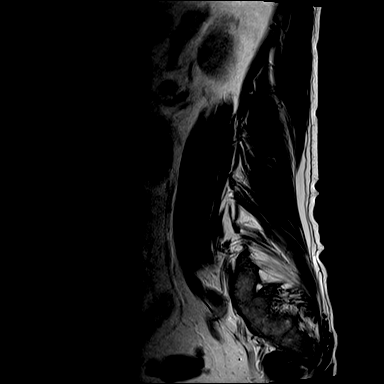
[im 3/15]
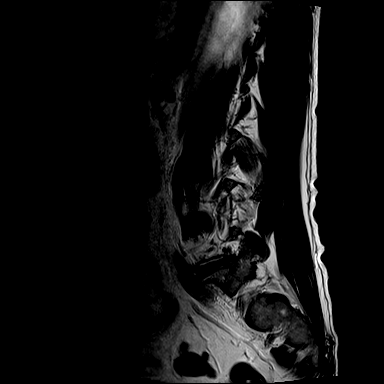
[im 5/15]
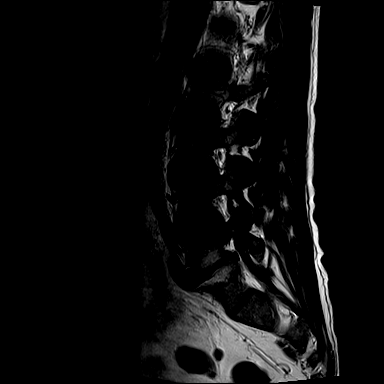
[im 8/15]
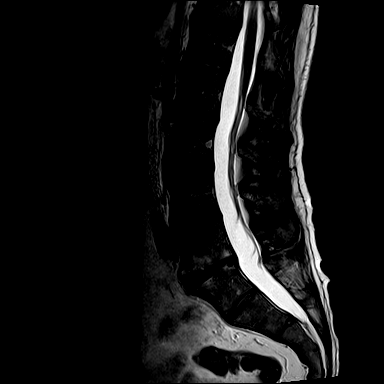
[im 10/15]
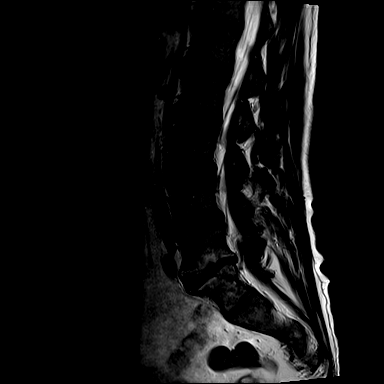
[im 12/15]
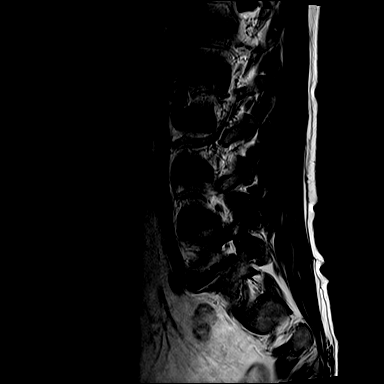
[im 15/15]
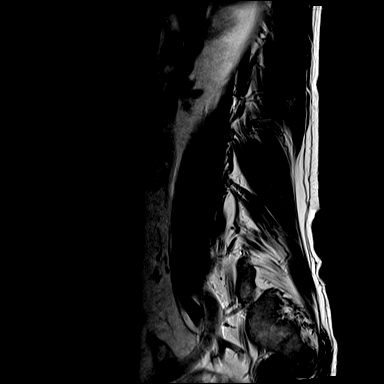

[Series 6: T1 · sagittal · 4.0mm · 0.81mm/px · 7 of 15 slices shown (1 of 2)]
[im 1/15]
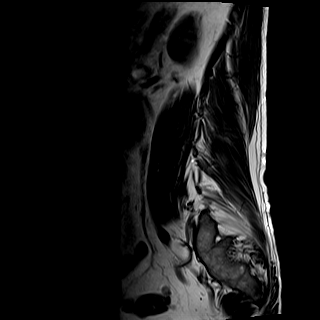
[im 3/15]
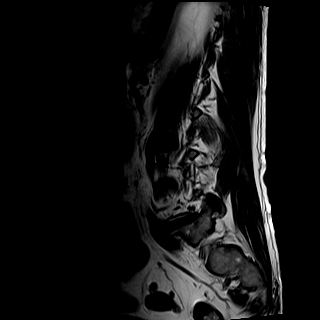
[im 5/15]
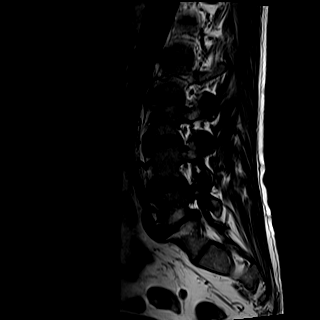
[im 8/15]
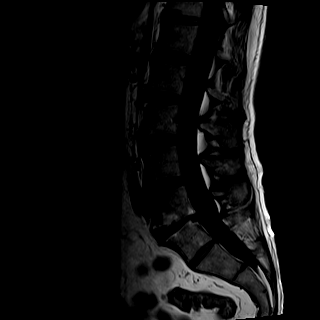
[im 10/15]
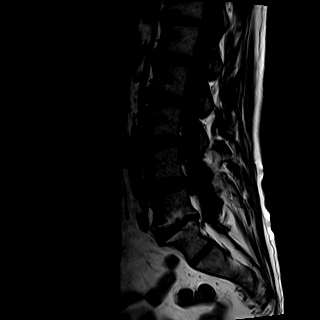
[im 12/15]
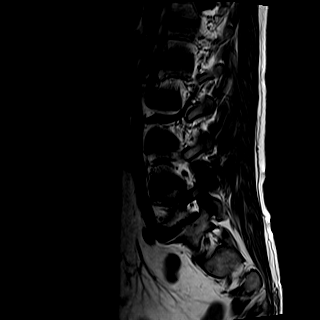
[im 15/15]
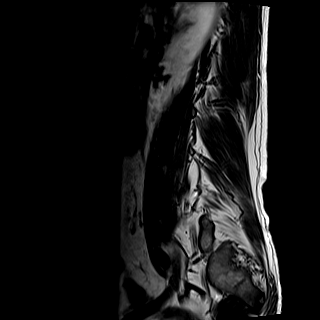

[Series 7: STIR · sagittal · 4.0mm · 0.51mm/px · 2 of 15 slices shown]
[im 1/15]
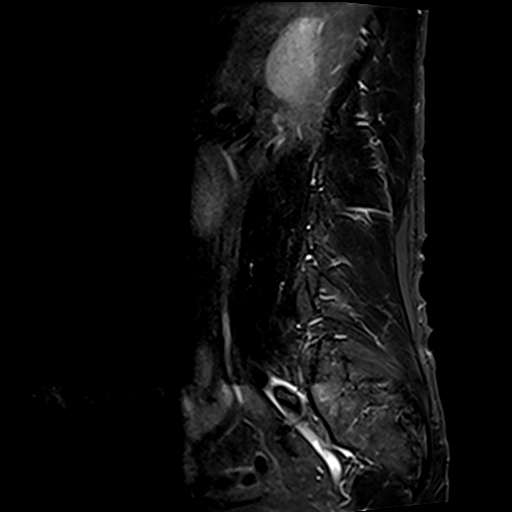
[im 3/15]
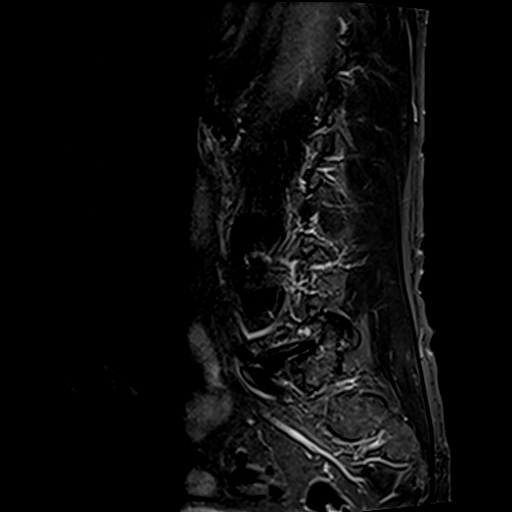

[Series 8: T2 · axial · 4.0mm · 0.70mm/px · z∈[-107,+126]mm · 8 of 33 slices shown (2 of 2)]
[im 1/33]
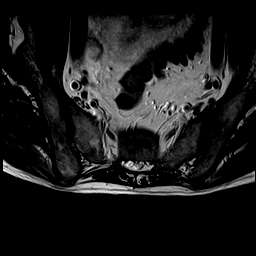
[im 5/33]
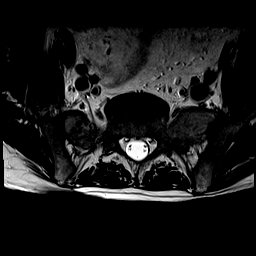
[im 10/33]
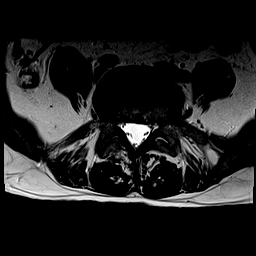
[im 15/33]
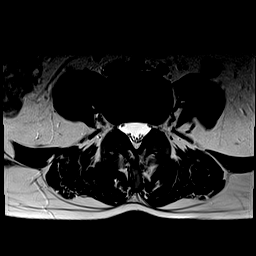
[im 18/33]
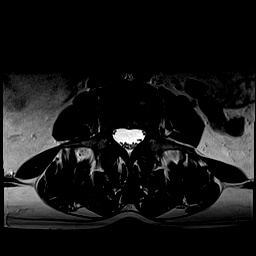
[im 23/33]
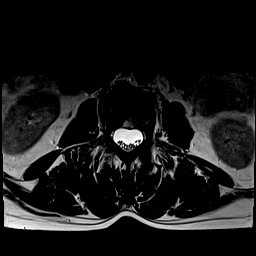
[im 28/33]
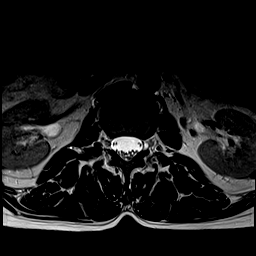
[im 33/33]
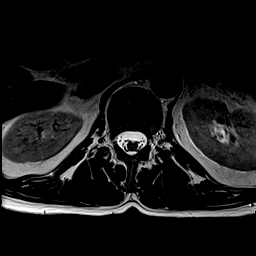

[Series 9: T1 · axial · 4.0mm · 0.35mm/px · z∈[-107,+126]mm · 8 of 33 slices shown (2 of 2)]
[im 1/33]
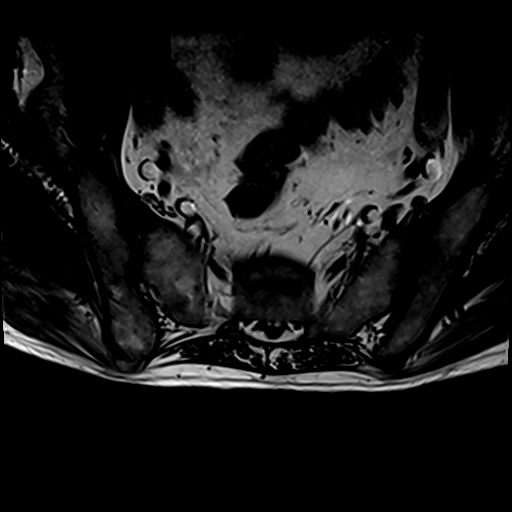
[im 5/33]
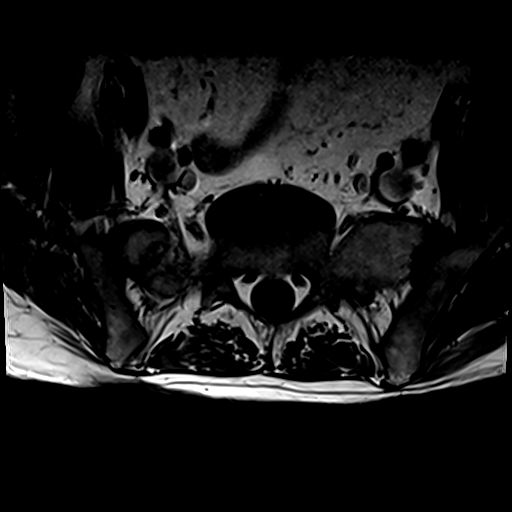
[im 10/33]
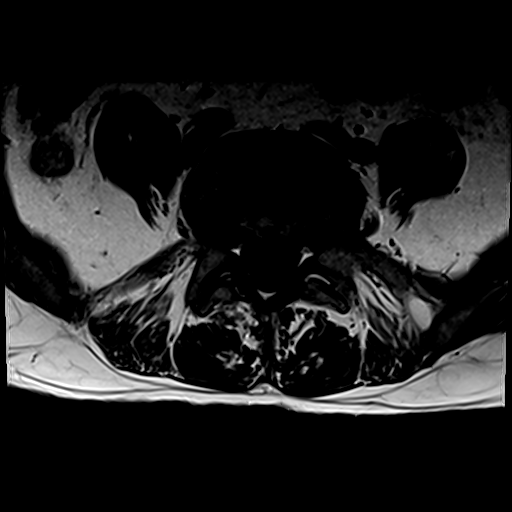
[im 15/33]
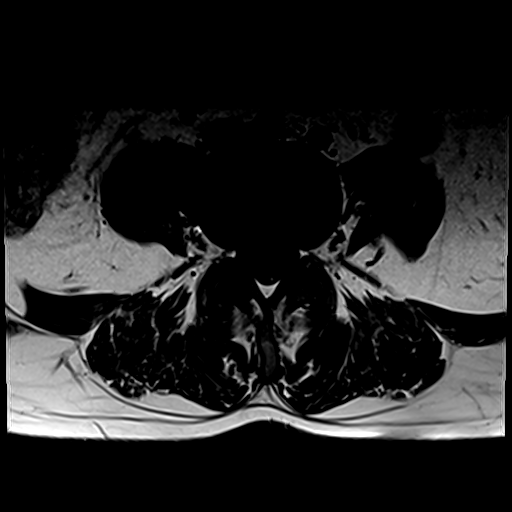
[im 18/33]
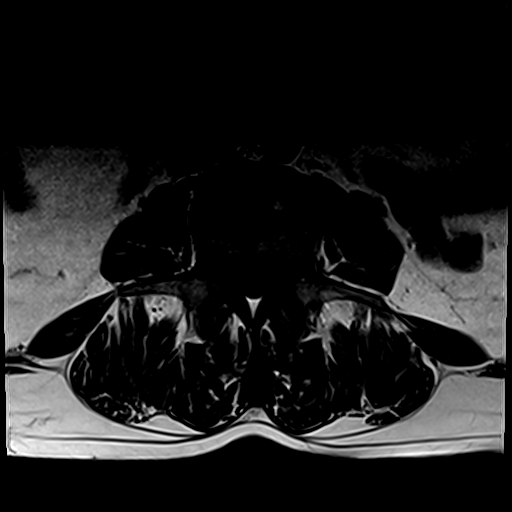
[im 23/33]
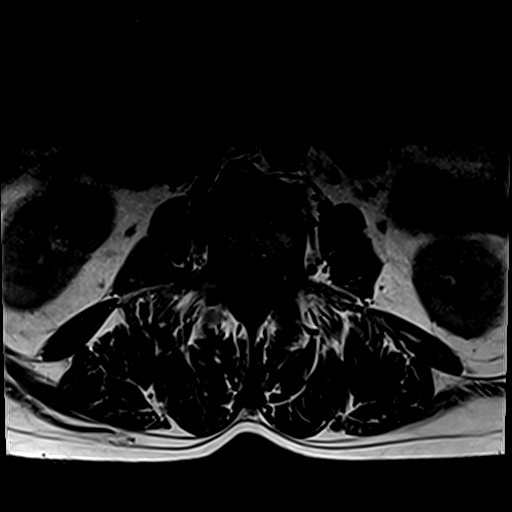
[im 28/33]
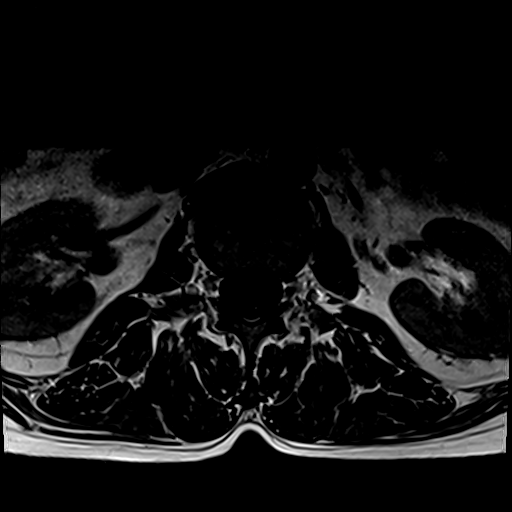
[im 33/33]
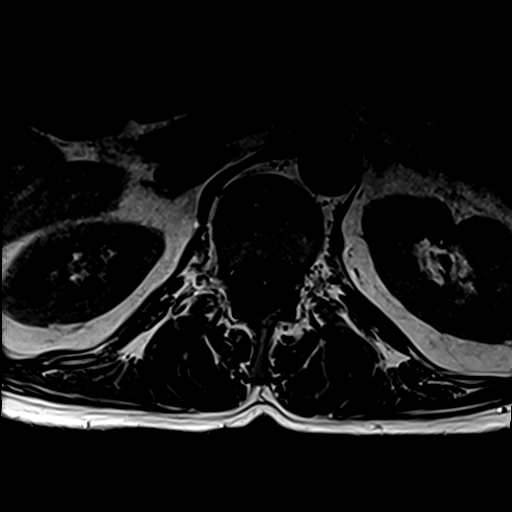

[32 of 48 positions shown; findings below may reference images not displayed]

FINDINGS: Segmentation:  Normal

Alignment:  Normal

Vertebrae:  Negative for fracture or mass.

Conus medullaris and cauda equina: Conus extends to the L2 level.
Conus and cauda equina appear normal.

Paraspinal and other soft tissues: Negative for paraspinous mass or
adenopathy.

Disc levels:

L1-2: Negative

L2-3: Mild disc and mild facet degeneration. Negative for disc
protrusion or stenosis

L3-4: Mild disc and mild facet degeneration. Negative for disc
protrusion or stenosis.

L4-5: Mild disc and mild facet degeneration. Negative for disc
protrusion or stenosis

L5-S1: Moderate disc degeneration with disc space narrowing.
Degenerative endplate changes with increased fat in the bone marrow
around the disc space. There is diffuse endplate spurring. Mild
subarticular stenosis bilaterally. No significant neural
impingement. Mild facet degeneration.
IMPRESSION: Mild lumbar degenerative changes most prominent L5-S1. No
significant neural impingement.

## 2022-10-07 ENCOUNTER — Encounter (INDEPENDENT_AMBULATORY_CARE_PROVIDER_SITE_OTHER): Payer: Self-pay | Admitting: Gastroenterology

## 2022-10-30 ENCOUNTER — Encounter: Payer: Self-pay | Admitting: Nurse Practitioner

## 2022-10-30 ENCOUNTER — Ambulatory Visit (INDEPENDENT_AMBULATORY_CARE_PROVIDER_SITE_OTHER): Payer: Self-pay | Admitting: Nurse Practitioner

## 2022-10-30 VITALS — BP 107/67 | HR 99 | Temp 98.7°F | Ht 69.0 in | Wt 126.0 lb

## 2022-10-30 DIAGNOSIS — M5441 Lumbago with sciatica, right side: Secondary | ICD-10-CM

## 2022-10-30 DIAGNOSIS — G8929 Other chronic pain: Secondary | ICD-10-CM

## 2022-10-30 DIAGNOSIS — M5442 Lumbago with sciatica, left side: Secondary | ICD-10-CM

## 2022-10-30 NOTE — Patient Instructions (Signed)

## 2022-10-30 NOTE — Progress Notes (Signed)
Acute Office Visit  Subjective:     Patient ID: Jason Lamb, male    DOB: 1961/11/09, 61 y.o.   MRN: [id number]  Chief Complaint  Patient presents with   Pain Management    Wants a referral     HPI Patient is in today for Pain  He reports chronic back pain. was not an injury that may have caused the pain. The pain started a few years ago and is gradually worsening. The pain does radiate bilateral lower thighs and legs. The pain is described as sharp, soreness, and stabbing, is 8/10 in intensity, occurring constantly. Symptoms are worse in the: morning, mid-day, afternoon, evening  Aggravating factors: bending backwards, bending forwards, bending sideways, and walking Relieving factors: Prescription pain medicine.  Vicodin 5-325 mg tablet by mouth 3 times daily as needed. .  He has tried prescription pain relievers with significant relief.   ---------------------------------------------------------------------------------------------------   Review of Systems  Constitutional: Negative.  Negative for chills and fever.  HENT: Negative.    Respiratory: Negative.    Cardiovascular: Negative.   Gastrointestinal: Negative.   Musculoskeletal:  Positive for back pain.  Skin: Negative.  Negative for itching and rash.  All other systems reviewed and are negative.       Objective:    BP 107/67   Pulse 99   Temp 98.7 F (37.1 C)   Ht 5\' 9"  (1.753 m)   Wt 126 lb (57.2 kg)   SpO2 94%   BMI 18.61 kg/m  BP Readings from Last 3 Encounters:  10/30/22 107/67  12/26/21 110/71  12/02/21 102/65   Wt Readings from Last 3 Encounters:  10/30/22 126 lb (57.2 kg)  12/26/21 135 lb (61.2 kg)  12/02/21 129 lb (58.5 kg)      Physical Exam Vitals and nursing note reviewed.  Constitutional:      Appearance: Normal appearance.  HENT:     Head: Normocephalic.     Right Ear: External ear normal.     Left Ear: External ear normal.     Nose: Nose normal.     Mouth/Throat:     Mouth:  Mucous membranes are moist.     Pharynx: Oropharynx is clear.  Eyes:     Conjunctiva/sclera: Conjunctivae normal.  Cardiovascular:     Rate and Rhythm: Normal rate and regular rhythm.     Pulses: Normal pulses.     Heart sounds: Normal heart sounds.  Pulmonary:     Effort: Pulmonary effort is normal.     Breath sounds: Normal breath sounds.  Abdominal:     General: Bowel sounds are normal.  Musculoskeletal:     Lumbar back: Tenderness present. Decreased range of motion.       Back:     Comments: Back pain  Skin:    General: Skin is warm.     Findings: No erythema or rash.  Neurological:     General: No focal deficit present.     Mental Status: He is alert and oriented to person, place, and time.     No results found for any visits on 10/30/22.      Assessment & Plan:   Problem List Items Addressed This Visit       Nervous and Auditory   Chronic bilateral low back pain with bilateral sciatica - Primary   Relevant Orders   Ambulatory referral to Pain Clinic    No orders of the defined types were placed in this encounter.   Return if  symptoms worsen or fail to improve.  Ivy Lynn, NP

## 2022-11-01 ENCOUNTER — Telehealth: Payer: Self-pay | Admitting: Nurse Practitioner

## 2022-11-01 NOTE — Telephone Encounter (Signed)
[facility] called to let us know patient would need to pay up front since he is self-pay.  Patient will need to call them to set up appointment at [phone].  I have left a message for patient to call back as well to explain this.

## 2022-12-22 ENCOUNTER — Ambulatory Visit: Payer: Self-pay | Admitting: Nurse Practitioner

## 2022-12-22 ENCOUNTER — Encounter: Payer: Self-pay | Admitting: Nurse Practitioner

## 2022-12-22 VITALS — BP 116/79 | HR 97 | Temp 97.8°F | Resp 20 | Ht 69.0 in | Wt 124.0 lb

## 2022-12-22 DIAGNOSIS — K4020 Bilateral inguinal hernia, without obstruction or gangrene, not specified as recurrent: Secondary | ICD-10-CM

## 2022-12-22 MED ORDER — NAPROXEN 500 MG PO TABS: 500.0000 mg | ORAL_TABLET | Freq: Two times a day (BID) | ORAL | 1 refills | Status: DC

## 2022-12-22 NOTE — Patient Instructions (Signed)
Inguinal Hernia, Adult An inguinal hernia develops when fat or the intestines push through a weak spot in a muscle where the leg meets the lower abdomen (groin). This creates a bulge. This kind of hernia could also be: In the scrotum, if you are male. In folds of skin around the vagina, if you are male. There are three types of inguinal hernias: Hernias that can be pushed back into the abdomen (are reducible). This type rarely causes pain. Hernias that are not reducible (are incarcerated). Hernias that are not reducible and lose their blood supply (are strangulated). This type of hernia requires emergency surgery. What are the causes? This condition is caused by having a weak spot in the muscles or tissues in your groin. This develops over time. The hernia may poke through the weak spot when you suddenly strain your lower abdominal muscles, such as when you: Lift a heavy object. Strain to have a bowel movement. Constipation can lead to straining. Cough. What increases the risk? This condition is more likely to develop in: Males. Pregnant females. People who: Are overweight. Work in jobs that require long periods of standing or heavy lifting. Have had an inguinal hernia before. Smoke or have lung disease. These factors can lead to long-term (chronic) coughing. What are the signs or symptoms? Symptoms may depend on the size of the hernia. Often, a small inguinal hernia has no symptoms. Symptoms of a larger hernia may include: A bulge in the groin area. This is easier to see when standing. It might not be visible when lying down. Pain or burning in the groin. This may get worse when lifting, straining, or coughing. A dull ache or a feeling of pressure in the groin. An unusual bulge in the scrotum, in males. Symptoms of a strangulated inguinal hernia may include: A bulge in your groin that is very painful and tender to the touch. A bulge that turns red or purple. Fever, nausea, and  vomiting. Inability to have a bowel movement or to pass gas. How is this diagnosed? This condition is diagnosed based on your symptoms, your medical history, and a physical exam. Your health care provider may feel your groin area and ask you to cough. How is this treated? Treatment depends on the size of your hernia and whether you have symptoms. If you do not have symptoms, your health care provider may have you watch your hernia carefully and have you come in for follow-up visits. If your hernia is large or if you have symptoms, you may need surgery to repair the hernia. Follow these instructions at home: Lifestyle Avoid lifting heavy objects. Avoid standing for long periods of time. Do not use any products that contain nicotine or tobacco. These products include cigarettes, chewing tobacco, and vaping devices, such as e-cigarettes. If you need help quitting, ask your health care provider. Maintain a healthy weight. Preventing constipation You may need to take these actions to prevent or treat constipation: Drink enough fluid to keep your urine pale yellow. Take over-the-counter or prescription medicines. Eat foods that are high in fiber, such as beans, whole grains, and fresh fruits and vegetables. Limit foods that are high in fat and processed sugars, such as fried or sweet foods. General instructions You may try to push the hernia back in place by very gently pressing on it while lying down. Do not try to force the bulge back in if it will not push in easily. Watch your hernia for any changes in shape, size, or   color. Get help right away if you notice any changes. Take over-the-counter and prescription medicines only as told by your health care provider. Keep all follow-up visits. This is important. Contact a health care provider if: You have a fever or chills. You develop new symptoms. Your symptoms get worse. Get help right away if: You have pain in your groin that suddenly gets  worse. You have a bulge in your groin that: Suddenly gets bigger and does not get smaller. Becomes red or purple or painful to the touch. You are a man and you have a sudden pain in your scrotum, or the size of your scrotum suddenly changes. You cannot push the hernia back in place by very gently pressing on it when you are lying down. You have nausea or vomiting that does not go away. You have a fast heartbeat. You cannot have a bowel movement or pass gas. These symptoms may represent a serious problem that is an emergency. Do not wait to see if the symptoms will go away. Get medical help right away. Call your local emergency services (911 in the [place] Summary An inguinal hernia develops when fat or the intestines push through a weak spot in a muscle where your leg meets your lower abdomen (groin). This condition is caused by having a weak spot in muscles or tissues in your groin. Symptoms may depend on the size of the hernia, and they may include pain or swelling in your groin. A small inguinal hernia often has no symptoms. Treatment may not be needed if you do not have symptoms. If you have symptoms or a large hernia, you may need surgery to repair the hernia. Avoid lifting heavy objects. Also, avoid standing for long periods of time. This information is not intended to replace advice given to you by your health care provider. Make sure you discuss any questions you have with your health care provider. Document Revised: 07/13/2020 Document Reviewed: 07/13/2020 Elsevier Patient Education  2023 Elsevier Inc.  

## 2022-12-22 NOTE — Progress Notes (Signed)
   Subjective:    Patient ID: Jason Lamb, male    DOB: 1961/09/02, 61 y.o.   MRN: [id number]   Chief Complaint: bilateral groin pain (For 1 month)   HPI Patient come sin c/o bil groin pain. Started about 1 month ago. Pain is intermittent. Worse when standing or walking. Laying down helps pain. Rates pain 10/10 when occurs. He has not taken any meds OTC. Says he does some heavy lfting at work.     Review of Systems  Constitutional:  Negative for diaphoresis.  Eyes:  Negative for pain.  Respiratory:  Negative for shortness of breath.   Cardiovascular:  Negative for chest pain, palpitations and leg swelling.  Gastrointestinal:  Negative for abdominal pain.  Endocrine: Negative for polydipsia.  Skin:  Negative for rash.  Neurological:  Negative for dizziness, weakness and headaches.  Hematological:  Does not bruise/bleed easily.  All other systems reviewed and are negative.      Objective:   Physical Exam Constitutional:      Appearance: Normal appearance.  Cardiovascular:     Rate and Rhythm: Normal rate and regular rhythm.     Heart sounds: Normal heart sounds.  Pulmonary:     Effort: Pulmonary effort is normal.     Breath sounds: Normal breath sounds.  Abdominal:     Hernia: A hernia (left inguinal area) is present.  Skin:    General: Skin is warm.     Findings: Lesion present.  Neurological:     General: No focal deficit present.     Mental Status: He is alert and oriented to person, place, and time.  Psychiatric:        Mood and Affect: Mood normal.        Behavior: Behavior normal.     BP 116/79   Pulse 97   Temp 97.8 F (36.6 C) (Temporal)   Resp 20   Ht 5\' 9"  (1.753 m)   Wt 124 lb (56.2 kg)   SpO2 97%   BMI 18.31 kg/m        Assessment & Plan:   Jason Lamb in today with chief complaint of bilateral groin pain (For 1 month)   1. Non-recurrent bilateral inguinal hernia without obstruction or gangrene Referral to general surgeon for  assessment Avoid heavy lifting and straining  - Ambulatory referral to General Surgery    The above assessment and management plan was discussed with the patient. The patient verbalized understanding of and has agreed to the management plan. Patient is aware to call the clinic if symptoms persist or worsen. Patient is aware when to return to the clinic for a follow-up visit. Patient educated on when it is appropriate to go to the emergency department.   Mary-Margaret Hassell Done, FNP

## 2023-01-16 ENCOUNTER — Encounter: Payer: Self-pay | Admitting: General Surgery

## 2023-01-16 ENCOUNTER — Encounter: Payer: Self-pay | Admitting: *Deleted

## 2023-01-16 ENCOUNTER — Ambulatory Visit (INDEPENDENT_AMBULATORY_CARE_PROVIDER_SITE_OTHER): Payer: BC Managed Care – PPO | Admitting: General Surgery

## 2023-01-16 VITALS — BP 127/83 | HR 98 | Temp 98.4°F | Resp 14 | Ht 69.0 in | Wt 125.0 lb

## 2023-01-16 DIAGNOSIS — R1032 Left lower quadrant pain: Secondary | ICD-10-CM | POA: Diagnosis not present

## 2023-01-16 NOTE — Progress Notes (Unsigned)
[facility] History and Physical  Reason for Referral:*** Referring Physician: ***  Chief Complaint   New Patient (Initial Visit)     Jason Lamb is a 62 y.o. male.  HPI: ***.  The *** started *** and has had a duration of ***.  It is associated with ***.  The *** is improved with ***, and is made worse with ***.    Quality*** Context***  Past Medical History:  Diagnosis Date   Anxiety    Arthritis    neck, lumbar spine   Back pain    Bronchitis    Chronic back pain    Depression    GERD (gastroesophageal reflux disease)    Graves disease    IBS (irritable bowel syndrome)     Past Surgical History:  Procedure Laterality Date   APPENDECTOMY     COLONOSCOPY N/A 04/08/2014   Procedure: COLONOSCOPY;  Surgeon: Rogene Houston, MD;  Location: [facility];  Service: Endoscopy;  Laterality: N/A;  135   ESOPHAGOGASTRODUODENOSCOPY N/A 04/08/2014   Procedure: ESOPHAGOGASTRODUODENOSCOPY (EGD);  Surgeon: Rogene Houston, MD;  Location: [facility];  Service: Endoscopy;  Laterality: N/A;    No family history on file.  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 Unknown Did it involve sudden or severe rash/hives, skin peeling, or any reaction on the inside of your mouth or nose? Unknown Did you need to seek  medical attention at a hospital or doctor's office? Unknown When did it last happen?       If all above answers are "NO", may proceed with cephalosporin use.        Medication List        Accurate as of January 16, 2023  9:56 AM. If you have any questions, ask your nurse or doctor.          gabapentin 300 MG capsule Commonly known as: NEURONTIN Take 300 mg by mouth 3 (three) times daily.   HYDROcodone-acetaminophen 5-325 MG tablet Commonly known as: NORCO/VICODIN Take 1 tablet by mouth 3 (three) times daily as needed for moderate pain.   naproxen 500 MG tablet Commonly known as: Naprosyn Take 1 tablet (500 mg total) by mouth 2 (two) times daily with a meal.         ROS:  {Review of Systems:30496}  Blood pressure 127/83, pulse 98, temperature 98.4 F (36.9 C), temperature source Oral, resp. rate 14, height 5' 9"$  (1.753 m), weight 125 lb (56.7 kg), SpO2 97 %. Physical Exam  Results: No results found for this or any previous visit (from the past 48 hour(s)).  No results found.   Assessment & Plan:  Jason Wickers  Lamb is a 62 y.o. male with *** -*** -*** -Follow up ***  All questions were answered to the satisfaction of the patient and family***.  The risk and benefits of *** were discussed including but not limited to ***.  After careful consideration, Jason Lamb has decided to ***.    Virl Cagey 01/16/2023, 9:56 AM

## 2023-01-16 NOTE — Patient Instructions (Addendum)
Will order CT to look at the area and make sure we dealing with a hernia versus something else.  In the mean time, monitor your pain. Go to the hospital if you experience any hard bulge that will not  go away or any signs of bowel blockage like nausea/vomiting that does not go away and is associated with worsening pain in the groin.   Will call with the results.

## 2023-01-17 ENCOUNTER — Ambulatory Visit (HOSPITAL_COMMUNITY)
Admission: RE | Admit: 2023-01-17 | Discharge: 2023-01-17 | Disposition: A | Discharge status: Home / Self Care | Payer: BC Managed Care – PPO | Source: Ambulatory Visit | Attending: General Surgery | Admitting: General Surgery

## 2023-01-17 DIAGNOSIS — R1032 Left lower quadrant pain: Secondary | ICD-10-CM | POA: Diagnosis not present

## 2023-01-17 LAB — POCT I-STAT CREATININE: Creatinine, Ser: 0.8 mg/dL (ref 0.61–1.24)

## 2023-01-17 MED ORDER — IOHEXOL 300 MG/ML  SOLN
75.0000 mL | Freq: Once | INTRAMUSCULAR | Status: AC | PRN
  Administered 2023-01-17: 75 mL via INTRAVENOUS

## 2023-01-22 NOTE — Progress Notes (Signed)
Can you let patient know that there was no hernia noted on the CT scan. I think he potentially has pulled a muscle given the location on his upper thigh. I think time and heating pad would help, and he can also take NSAIDs. He can follow up with PCP if this continues to cause him pain.

## 2023-06-18 ENCOUNTER — Other Ambulatory Visit: Payer: Self-pay

## 2023-06-18 ENCOUNTER — Emergency Department (HOSPITAL_COMMUNITY): Payer: Self-pay

## 2023-06-18 ENCOUNTER — Encounter (HOSPITAL_COMMUNITY): Payer: Self-pay

## 2023-06-18 ENCOUNTER — Emergency Department (HOSPITAL_COMMUNITY)
Admission: EM | Admit: 2023-06-18 | Discharge: 2023-06-18 | Disposition: A | Discharge status: Home / Self Care | Payer: Self-pay | Attending: Emergency Medicine | Admitting: Emergency Medicine

## 2023-06-18 DIAGNOSIS — W06XXXA Fall from bed, initial encounter: Secondary | ICD-10-CM | POA: Insufficient documentation

## 2023-06-18 DIAGNOSIS — S80212A Abrasion, left knee, initial encounter: Secondary | ICD-10-CM | POA: Insufficient documentation

## 2023-06-18 DIAGNOSIS — S50311A Abrasion of right elbow, initial encounter: Secondary | ICD-10-CM | POA: Insufficient documentation

## 2023-06-18 DIAGNOSIS — W19XXXA Unspecified fall, initial encounter: Secondary | ICD-10-CM

## 2023-06-18 DIAGNOSIS — S7002XA Contusion of left hip, initial encounter: Secondary | ICD-10-CM | POA: Insufficient documentation

## 2023-06-18 DIAGNOSIS — S80211A Abrasion, right knee, initial encounter: Secondary | ICD-10-CM | POA: Insufficient documentation

## 2023-06-18 DIAGNOSIS — S00211A Abrasion of right eyelid and periocular area, initial encounter: Secondary | ICD-10-CM | POA: Insufficient documentation

## 2023-06-18 DIAGNOSIS — R000 Tachycardia, unspecified: Secondary | ICD-10-CM | POA: Insufficient documentation

## 2023-06-18 LAB — URINALYSIS, ROUTINE W REFLEX MICROSCOPIC
Bilirubin Urine: NEGATIVE
Glucose, UA: NEGATIVE mg/dL
Ketones, ur: NEGATIVE mg/dL
Leukocytes,Ua: NEGATIVE
Nitrite: NEGATIVE
Protein, ur: 100 mg/dL — AB
Specific Gravity, Urine: 1.01 (ref 1.005–1.030)
pH: 6 (ref 5.0–8.0)

## 2023-06-18 LAB — CBC
HCT: 41.9 % (ref 39.0–52.0)
Hemoglobin: 14.1 g/dL (ref 13.0–17.0)
MCH: 29 pg (ref 26.0–34.0)
MCHC: 33.7 g/dL (ref 30.0–36.0)
MCV: 86 fL (ref 80.0–100.0)
Platelets: 218 10*3/uL (ref 150–400)
RBC: 4.87 MIL/uL (ref 4.22–5.81)
RDW: 12.8 % (ref 11.5–15.5)
WBC: 19.4 10*3/uL — ABNORMAL HIGH (ref 4.0–10.5)
nRBC: 0 % (ref 0.0–0.2)

## 2023-06-18 LAB — BASIC METABOLIC PANEL
Anion gap: 9 (ref 5–15)
BUN: 12 mg/dL (ref 6–20)
CO2: 25 mmol/L (ref 22–32)
Calcium: 8.6 mg/dL — ABNORMAL LOW (ref 8.9–10.3)
Chloride: 102 mmol/L (ref 98–111)
Creatinine, Ser: 0.91 mg/dL (ref 0.61–1.24)
GFR, Estimated: >60 mL/min (ref >60)
Glucose, Bld: 97 mg/dL (ref 70–99)
Potassium: 3.3 mmol/L — ABNORMAL LOW (ref 3.5–5.1)
Sodium: 136 mmol/L (ref 135–145)

## 2023-06-18 MED ORDER — LIDOCAINE 5 % EX PTCH
1.0000 | MEDICATED_PATCH | CUTANEOUS | Status: DC
  Administered 2023-06-18: 1 via TRANSDERMAL
  Filled 2023-06-18: qty 1

## 2023-06-18 MED ORDER — SODIUM CHLORIDE 0.9 % IV BOLUS
1000.0000 mL | Freq: Once | INTRAVENOUS | Status: AC
  Administered 2023-06-18: 1000 mL via INTRAVENOUS

## 2023-06-18 MED ORDER — FENTANYL CITRATE PF 50 MCG/ML IJ SOSY
50.0000 ug | PREFILLED_SYRINGE | Freq: Once | INTRAMUSCULAR | Status: AC
  Administered 2023-06-18: 50 ug via INTRAVENOUS
  Filled 2023-06-18: qty 1

## 2023-06-18 NOTE — ED Notes (Signed)
Patient transported to CT via stretcher in NAD.

## 2023-06-18 NOTE — ED Notes (Signed)
Patient verbalizes understanding of discharge instructions. Opportunity for questioning and answers were provided. Armband removed by staff, pt discharged from ED. Ambulated out to car with walker and granddaughter

## 2023-06-18 NOTE — ED Notes (Signed)
Pt said he fell out of the bed 3 separate times today. Said he laid down to take a nap and the first time, he rolled off the bed. The 2nd time, after it took "10-15 min to get back on the bed", he was reaching for something to drink on the nightstand. The 3rd time, he was also reaching for something he said. All with in the same nap time.

## 2023-06-18 NOTE — ED Provider Notes (Signed)
[place] EMERGENCY DEPARTMENT AT [facility] Provider Note   CSN: [id number] Arrival date & time: 06/18/23  1429     History  Chief Complaint  Patient presents with   Jason Lamb is a 62 y.o. male with a history of emphysema, Graves' disease, arthritis, and GERD who presents to the ED today after a fall.  Reports that he fell getting out of his bed this morning and hit right side of face as well as left hip pain.  He reports that it hurts to walk.  Patient has abrasions right eyebrow,  right elbow, and bilateral knees. He denies any loss of consciousness or blood thinner use. No headache, vision changes, weakness, or loss of sensation. Denies any other complaints or concerns at this time.    Home Medications Prior to Admission medications   Medication Sig Start Date End Date Taking? Authorizing Provider  gabapentin (NEURONTIN) 300 MG capsule Take 300 mg by mouth 3 (three) times daily. 04/30/20   [provider]  HYDROcodone-acetaminophen (NORCO/VICODIN) 5-325 MG per tablet Take 1 tablet by mouth 3 (three) times daily as needed for moderate pain.     [provider]  naproxen (NAPROSYN) 500 MG tablet Take 1 tablet (500 mg total) by mouth 2 (two) times daily with a meal. 12/22/22   Daphine Deutscher Mary-Margaret, FNP      Allergies    Penicillins    Review of Systems   Review of Systems  Musculoskeletal:        Left hip pain  All other systems reviewed and are negative.   Physical Exam Updated Vital Signs BP 118/83   Pulse (!) 111   Temp 98.7 F (37.1 C) (Oral)   Resp (!) 25   Ht 5\' 8"  (1.727 m)   Wt 59 kg   SpO2 96%   BMI 19.77 kg/m  Physical Exam Vitals and nursing note reviewed.  Constitutional:      Appearance: Normal appearance.  HENT:     Head: Normocephalic.     Comments: Ecchymosis to his right eyebrow    Right Ear: Tympanic membrane, ear canal and external ear normal.     Left Ear: Tympanic membrane, ear canal and external ear  normal.     Nose: Nose normal.     Mouth/Throat:     Mouth: Mucous membranes are moist.  Eyes:     Conjunctiva/sclera: Conjunctivae normal.     Pupils: Pupils are equal, round, and reactive to light.  Cardiovascular:     Rate and Rhythm: Regular rhythm. Tachycardia present.     Pulses: Normal pulses.     Heart sounds: Normal heart sounds.  Pulmonary:     Effort: Pulmonary effort is normal.     Breath sounds: Normal breath sounds.  Abdominal:     Palpations: Abdomen is soft.     Tenderness: There is no abdominal tenderness.  Musculoskeletal:        General: Tenderness present.     Comments: Tenderness to palpation of left hip with ecchymosis present.  No bony deformity appreciated to left hip.  No tenderness to palpation of right hip or bilateral thighs or calves.  Upper and lower extremity strength intact.  Skin:    General: Skin is warm and dry.     Findings: Bruising and erythema present. No rash.     Comments: Abrasion to the right elbow. Superficial abrasions to the bilateral knees.  Neurological:     General: No focal  deficit present.     Mental Status: He is alert.     Sensory: No sensory deficit.     Motor: No weakness.  Psychiatric:        Mood and Affect: Mood normal.        Behavior: Behavior normal.     ED Results / Procedures / Treatments   Labs (all labs ordered are listed, but only abnormal results are displayed) Labs Reviewed  BASIC METABOLIC PANEL - Abnormal; Notable for the following components:      Result Value   Potassium 3.3 (*)    Calcium 8.6 (*)    All other components within normal limits  CBC - Abnormal; Notable for the following components:   WBC 19.4 (*)    All other components within normal limits  URINALYSIS, ROUTINE W REFLEX MICROSCOPIC - Abnormal; Notable for the following components:   Color, Urine AMBER (*)    APPearance HAZY (*)    Hgb urine dipstick LARGE (*)    Protein, ur 100 (*)    Bacteria, UA RARE (*)    All other components  within normal limits    EKG None  Radiology CT Hip Left Wo Contrast  Result Date: 06/18/2023 CLINICAL DATA:  Left hip pain after fall. EXAM: CT OF THE LEFT HIP WITHOUT CONTRAST TECHNIQUE: Multidetector CT imaging of the left hip was performed according to the standard protocol. Multiplanar CT image reconstructions were also generated. RADIATION DOSE REDUCTION: This exam was performed according to the departmental dose-optimization program which includes automated exposure control, adjustment of the mA and/or kV according to patient size and/or use of iterative reconstruction technique. COMPARISON:  Plain film today.  CT 01/17/2023. FINDINGS: Bones/Joint/Cartilage No acute bony abnormality. Specifically, no fracture, subluxation, or dislocation. Joint space maintained. Ligaments Suboptimally assessed by CT. Muscles and Tendons Unremarkable Soft tissues Stranding within the subcutaneous soft tissues of laterally likely reflecting hematoma. IMPRESSION: No evidence of left hip fracture.  No acute bony abnormality. Subcutaneous stranding laterally over the left hip, likely bruising/hematoma. Electronically Signed   By: Charlett Nose M.D.   On: 06/18/2023 20:33   DG Chest Portable 1 View  Result Date: 06/18/2023 CLINICAL DATA:  Fall, evaluate for pneumonia EXAM: PORTABLE CHEST 1 VIEW COMPARISON:  06/13/2020 FINDINGS: Cardiac size is within normal limits. There are no signs of pulmonary edema. Small linear densities are seen in lower lung fields. There is interval improvement in aeration in right lower lung field. There is no focal consolidation. There is no pleural effusion or pneumothorax. IMPRESSION: There are small linear densities in both lower lung fields suggesting scarring or subsegmental atelectasis. There is interval improvement in aeration in right lower lung field. There are no signs of pulmonary edema or focal pulmonary consolidation. There is no pleural effusion or pneumothorax. Electronically  Signed   By: Ernie Avena M.D.   On: 06/18/2023 19:43   DG Hip Unilat W or Wo Pelvis 2-3 Views Left  Result Date: 06/18/2023 CLINICAL DATA:  Pain after fall. EXAM: DG HIP (WITH OR WITHOUT PELVIS) 3V LEFT COMPARISON:  None Available. FINDINGS: No fracture or dislocation. Preserved joint spaces and bone mineralization. IMPRESSION: No acute osseous abnormality Electronically Signed   By: Karen Kays M.D.   On: 06/18/2023 15:40    Procedures Procedures: not indicated.   Medications Ordered in ED Medications  lidocaine (LIDODERM) 5 % 1 patch (1 patch Transdermal Patch Applied 06/18/23 1740)  sodium chloride 0.9 % bolus 1,000 mL (0 mLs Intravenous Stopped 06/18/23  1900)  fentaNYL (SUBLIMAZE) injection 50 mcg (50 mcg Intravenous Given 06/18/23 1913)    ED Course/ Medical Decision Making/ A&P                             Medical Decision Making Amount and/or Complexity of Data Reviewed Labs: ordered. Radiology: ordered.  Risk Prescription drug management.   This patient presents to the ED for concern of left hip pain, this involves an extensive number of treatment options, and is a complaint that carries with it a high risk of complications and morbidity.   Differential diagnosis includes: fracture vs dislocation vs hematoma, etc.   Co morbidities that complicate the patient evaluation  Tobacco use Chronic low back pain Graves Disease Anxiety   Additional history obtained:  Additional history obtained from patient's records.   Cardiac Monitoring / EKG:  The patient was maintained on a cardiac monitor.  I personally viewed and interpreted the cardiac monitored which showed: sinus tachycardia with a heart rate of 106 bpm.   Lab Tests:  I ordered and personally interpreted labs.  The pertinent results include:   BMP is within normal limits. Elevated WBC otherwise within normal limits. UA is within normal limits   Imaging Studies ordered:  I ordered imaging  studies including left hip and chest x-ray.  Left hip x-ray showed no acute fracture or dislocation.  Chest x-ray showed atelectasis on bilateral lower lobes, which is noted on prior imaging in 2021. I then ordered CT left hip which showed bruising/hematoma of left hip without evidence of fracture or other acute bony abnormality. Canadian CT score does not indicate need for head imaging at this time. I offered imaging for patient due to his facial bruising but he declined at this time. I agree with the radiologist interpretation   Problem List / ED Course / Critical interventions / Medication management  Left hip pain I ordered medications including: Lidocaine patch for pain relief - on reevaluation patient reports some relief Fentanyl injection subsequently for which patient reported relief of symptoms 1 L of normal saline was given for patient's tachycardia I have reviewed the patients home medicines and have made adjustments as needed   Social Determinants of Health:  Housing Physical activity   Test / Admission - Considered:  Discussed results with patient. Patient would like to go home. Instructed to follow up with PCP and repeat CBC. Strict return precautions provided.        Final Clinical Impression(s) / ED Diagnoses Final diagnoses:  Fall, initial encounter  Hematoma of left hip, initial encounter    Rx / DC Orders ED Discharge Orders     None         Maxwell Marion, PA-C 06/18/23 2338    Bethann Berkshire, MD 06/20/23 1154

## 2023-06-18 NOTE — Discharge Instructions (Addendum)
As discussed, your imaging shows that you have a hematoma to your left hip. You can ambulate with a walker as needed until your pain subsides. You can use ice to help with pain and inflammation as well as alternating between ibuprofen and Tylenol every 4-6 hours.  Make sure you are drinking plenty of water as well.  Call your primary care provider and make a follow-up appointment within the next 48 to 72 hours for reevaluation.  Return the ED if: Your pain worsens, you develop shortness of breath, or fever.

## 2023-06-18 NOTE — ED Triage Notes (Signed)
Fell off bed a couple of hours ago and complains of LEFT hip pain  Pt hit head Denies LOC and thinners Pt also complains of lumbar pain and B/L arm pain  Noted abrasion on knees in triage

## 2023-06-19 ENCOUNTER — Telehealth: Payer: Self-pay

## 2023-06-19 NOTE — Transitions of Care (Post Inpatient/ED Visit) (Unsigned)
   06/19/2023  Name: Jason Lamb MRN: [id number] DOB: 11/16/1961  Today's TOC FU Call Status: Today's TOC FU Call Status:: Unsuccessul Call (1st Attempt) Unsuccessful Call (1st Attempt) Date: 06/19/23  Attempted to reach the patient regarding the most recent Inpatient/ED visit.  Follow Up Plan: Additional outreach attempts will be made to reach the patient to complete the Transitions of Care (Post Inpatient/ED visit) call.   Signature Karena Addison, LPN [facility] Nurse Health Advisor Direct Dial [phone]

## 2023-06-20 NOTE — Transitions of Care (Post Inpatient/ED Visit) (Signed)
   06/20/2023  Name: Jason Lamb MRN: [id number] DOB: 1961-02-01  Today's TOC FU Call Status: Today's TOC FU Call Status:: Successful TOC FU Call Competed Unsuccessful Call (1st Attempt) Date: 06/19/23 [facility] FU Call Complete Date: 06/20/23  Transition Care Management Follow-up Telephone Call Date of Discharge: 06/18/23 Discharge [facility]: [facility] ([facility] Type of Discharge: Emergency Department Reason for ED Visit: Other: (fall) How have you been since you were released from the hospital?: Same Any questions or concerns?: No  Items Reviewed: Did you receive and understand the discharge instructions provided?: No Medications obtained,verified, and reconciled?: Yes (Medications Reviewed) Any new allergies since your discharge?: No Dietary orders reviewed?: NA Do you have support at home?: Yes People in Home: child(ren), adult  Medications Reviewed Today: Medications Reviewed Today     Reviewed by Karena Addison, LPN (Licensed Practical Nurse) on 06/20/23 at 1545  Med List Status: <None>   Medication Order Taking? Sig Documenting Provider Last Dose Status Informant  gabapentin (NEURONTIN) 300 MG capsule [id number] No Take 300 mg by mouth 3 (three) times daily. [provider] Taking Active Self  HYDROcodone-acetaminophen (NORCO/VICODIN) 5-325 MG per tablet [id number] No Take 1 tablet by mouth 3 (three) times daily as needed for moderate pain.  [provider] Taking Active Self  naproxen (NAPROSYN) 500 MG tablet [id number] No Take 1 tablet (500 mg total) by mouth 2 (two) times daily with a meal. Bennie Pierini, FNP Taking Active             Home Care and Equipment/Supplies: Were Home Health Services Ordered?: NA Any new equipment or medical supplies ordered?: NA  Functional Questionnaire: Do you need assistance with bathing/showering or dressing?: No Do you need assistance with meal preparation?: No Do you need assistance with eating?: No Do you have  difficulty maintaining continence: No Do you need assistance with getting out of bed/getting out of a chair/moving?: No Do you have difficulty managing or taking your medications?: No  Follow up appointments reviewed: PCP Follow-up appointment confirmed?: Yes Date of PCP follow-up appointment?: 06/21/23 Follow-up Provider: [facility] Follow-up appointment confirmed?: NA Do you need transportation to your follow-up appointment?: No Do you understand care options if your condition(s) worsen?: Yes-patient verbalized understanding    SIGNATURE Karena Addison, LPN [facility] Nurse Health Advisor Direct Dial [phone]

## 2023-06-21 ENCOUNTER — Encounter: Payer: Self-pay | Admitting: Family Medicine

## 2023-06-21 ENCOUNTER — Ambulatory Visit (INDEPENDENT_AMBULATORY_CARE_PROVIDER_SITE_OTHER): Payer: Self-pay | Admitting: Family Medicine

## 2023-06-21 VITALS — BP 102/69 | HR 94 | Temp 98.5°F | Ht 68.0 in | Wt 125.0 lb

## 2023-06-21 DIAGNOSIS — S7002XD Contusion of left hip, subsequent encounter: Secondary | ICD-10-CM

## 2023-06-21 DIAGNOSIS — R000 Tachycardia, unspecified: Secondary | ICD-10-CM

## 2023-06-21 DIAGNOSIS — W19XXXD Unspecified fall, subsequent encounter: Secondary | ICD-10-CM

## 2023-06-21 DIAGNOSIS — D72829 Elevated white blood cell count, unspecified: Secondary | ICD-10-CM

## 2023-06-21 DIAGNOSIS — E876 Hypokalemia: Secondary | ICD-10-CM

## 2023-06-21 NOTE — Progress Notes (Signed)
Acute Office Visit  Subjective:     Patient ID: Jason Lamb, male    DOB: Oct 15, 1961, 62 y.o.   MRN: [id number]  Chief Complaint  Patient presents with   Follow-up    Larey Seat out of bed Hip injury Elevated WBC - repeat CBC Tachycardia    HPI Patient is in today for an ER follow up. He was seen in the ER on 06/18/23 at [facility] following a fall. He fell when getting out of his bed that morning. He his his face and left pain. He has abrasions to his forehead, right elbow, and both knees. He did not lose consciousness. In the ER the had a negative hip xray. CT of hip with hematoma. Potassium was low and WBC was elevated on labs. His HR was also elevated. He had an EKG with sinus tachy, chest xray negative for acute findings.   He reports that hip pain is much better. It still feels quite achy and sore. He also has soreness in both arms. Pain is worse with weight bearing or lifting. Denies numbness or tingling. He has been taking tylenol with good relief.   Denies cough, congestions, fever, chills, nausea, vomiting, diarrhea, dizziness, lightheadedness, weight loss, changes in vision, confusion.   ROS As per HPI.      Objective:    BP 102/69   Pulse (!) 103   Temp 98.5 F (36.9 C)   Ht 5\' 8"  (1.727 m)   Wt 125 lb (56.7 kg)   SpO2 94%   BMI 19.01 kg/m  BP Readings from Last 3 Encounters:  06/21/23 102/69  06/18/23 118/83  01/16/23 127/83   Pulse Readings from Last 3 Encounters:  06/21/23 (!) 103  06/18/23 (!) 111  01/16/23 98   Wt Readings from Last 3 Encounters:  06/21/23 125 lb (56.7 kg)  06/18/23 130 lb (59 kg)  01/16/23 125 lb (56.7 kg)      Physical Exam Vitals and nursing note reviewed.  Constitutional:      General: He is not in acute distress.    Appearance: He is underweight. He is not ill-appearing, toxic-appearing or diaphoretic.  HENT:     Head:     Comments: Superficial abrasion to forehead. No signs of infection Eyes:     Extraocular Movements:  Extraocular movements intact.     Pupils: Pupils are equal, round, and reactive to light.  Cardiovascular:     Rate and Rhythm: Normal rate and regular rhythm.     Heart sounds: Normal heart sounds. No murmur heard. Pulmonary:     Effort: Pulmonary effort is normal. No respiratory distress.     Breath sounds: Normal breath sounds. No wheezing, rhonchi or rales.  Musculoskeletal:     Cervical back: Normal range of motion. No rigidity.     Left hip: Tenderness present. No bony tenderness. Normal strength.     Right lower leg: No edema.     Left lower leg: No edema.     Comments: Superficial abrasions to bilateral knee, right elbow. No signs of infection.   Skin:    General: Skin is warm and dry.  Neurological:     General: No focal deficit present.     Mental Status: He is alert and oriented to person, place, and time.     Cranial Nerves: No cranial nerve deficit.     Sensory: No sensory deficit.     Motor: No weakness.     Gait: Gait abnormal (antalgic).  Psychiatric:  Mood and Affect: Mood normal.        Behavior: Behavior normal.     No results found for any visits on 06/21/23.      Assessment & Plan:   Nikoli was seen today for follow-up.  Diagnoses and all orders for this visit:  Fall, subsequent encounter Hematoma of left hip, subsequent encounter Continue tylenol for pain. Rest, elevation. Reviewed ER note, labs, imaging, EKG.   Leukocytosis, unspecified type Denies symptoms of infection. Repeat pending.  -     CBC with Differential/Platelet  Hypokalemia Repeat pending.  -     CMP14+EGFR  Tachycardia Normal rate on exam. Asymptomatic. EKG with sinus tachy reviewed.   Return if symptoms worsen or fail to improve.  The patient indicates understanding of these issues and agrees with the plan.   Gabriel Earing, FNP

## 2023-06-22 ENCOUNTER — Emergency Department (HOSPITAL_COMMUNITY): Payer: Self-pay

## 2023-06-22 ENCOUNTER — Emergency Department (HOSPITAL_COMMUNITY)
Admission: EM | Admit: 2023-06-22 | Discharge: 2023-06-22 | Disposition: A | Discharge status: Home / Self Care | Payer: Self-pay | Attending: Emergency Medicine | Admitting: Emergency Medicine

## 2023-06-22 ENCOUNTER — Encounter (HOSPITAL_COMMUNITY): Payer: Self-pay | Admitting: Emergency Medicine

## 2023-06-22 ENCOUNTER — Telehealth: Payer: Self-pay | Admitting: Gastroenterology

## 2023-06-22 ENCOUNTER — Other Ambulatory Visit: Payer: Self-pay

## 2023-06-22 DIAGNOSIS — R7989 Other specified abnormal findings of blood chemistry: Secondary | ICD-10-CM

## 2023-06-22 DIAGNOSIS — D72829 Elevated white blood cell count, unspecified: Secondary | ICD-10-CM | POA: Insufficient documentation

## 2023-06-22 LAB — HEPATITIS PANEL, ACUTE
HCV Ab: NONREACTIVE
Hep A IgM: NONREACTIVE
Hep B C IgM: NONREACTIVE
Hepatitis B Surface Ag: NONREACTIVE

## 2023-06-22 LAB — COMPREHENSIVE METABOLIC PANEL
ALT: 243 U/L — ABNORMAL HIGH (ref 0–44)
AST: 673 U/L — ABNORMAL HIGH (ref 15–41)
Albumin: 3.6 g/dL (ref 3.5–5.0)
Alkaline Phosphatase: 70 U/L (ref 38–126)
Anion gap: 11 (ref 5–15)
BUN: 10 mg/dL (ref 6–20)
CO2: 27 mmol/L (ref 22–32)
Calcium: 8.6 mg/dL — ABNORMAL LOW (ref 8.9–10.3)
Chloride: 100 mmol/L (ref 98–111)
Creatinine, Ser: 0.98 mg/dL (ref 0.61–1.24)
GFR, Estimated: >60 mL/min (ref >60)
Glucose, Bld: 109 mg/dL — ABNORMAL HIGH (ref 70–99)
Potassium: 4 mmol/L (ref 3.5–5.1)
Sodium: 138 mmol/L (ref 135–145)
Total Bilirubin: 0.6 mg/dL (ref 0.3–1.2)
Total Protein: 6.8 g/dL (ref 6.5–8.1)

## 2023-06-22 LAB — CBC WITH DIFFERENTIAL/PLATELET
Lymphocytes Absolute: 2.5 10*3/uL (ref 0.7–3.1)
MCV: 87 fL (ref 79–97)
Monocytes: 6 %

## 2023-06-22 LAB — CBC
HCT: 40.8 % (ref 39.0–52.0)
Hemoglobin: 13.5 g/dL (ref 13.0–17.0)
MCH: 29.2 pg (ref 26.0–34.0)
MCHC: 33.1 g/dL (ref 30.0–36.0)
MCV: 88.3 fL (ref 80.0–100.0)
Platelets: 287 10*3/uL (ref 150–400)
RBC: 4.62 MIL/uL (ref 4.22–5.81)
RDW: 13.1 % (ref 11.5–15.5)
WBC: 9.7 10*3/uL (ref 4.0–10.5)
nRBC: 0 % (ref 0.0–0.2)

## 2023-06-22 LAB — PROTIME-INR
INR: 0.9 (ref 0.8–1.2)
Prothrombin Time: 12.7 seconds (ref 11.4–15.2)

## 2023-06-22 LAB — LIPASE, BLOOD: Lipase: 26 U/L (ref 11–51)

## 2023-06-22 LAB — ACETAMINOPHEN LEVEL: Acetaminophen (Tylenol), Serum: 10 ug/mL (ref 10–30)

## 2023-06-22 NOTE — ED Triage Notes (Signed)
Pt via POV after receiving a call from his doctor to notify him that recent blood work showed abnormal liver enzymes and that he should come to ER for eval. Pt denies prior hx liver disease. Pt denies pain but says he "doesn't feel great" and feels "just a little off."

## 2023-06-22 NOTE — Telephone Encounter (Signed)
Please make patient a new patient appointment with Dr. Tasia Catchings or any APP for work up of elevated LFTs. Seen in ED for this and will be discharged. Advised EDP to have PCP send referral to [facility]. Office given Dr. Tasia Catchings was on call today. Formal consult not done therefore patient will be a new patient to the practice.   Brooke Bonito, MSN, APRN, FNP-BC, AGACNP-BC [facility] at [facility]

## 2023-06-22 NOTE — ED Provider Notes (Signed)
[place] EMERGENCY DEPARTMENT AT [facility] Provider Note   CSN: [id number] Arrival date & time: 06/22/23  1223     History  Chief Complaint  Patient presents with   Abnormal Lab         Jason Lamb is a 62 y.o. male.  He has a history of thyroid disease GERD chronic back pain.  He was seen here 4 days ago after he had fallen out of bed.  Had significant hip pain along with abrasions head knees.  Imaging was negative, baseline labs showed low potassium elevated white count.  He was discharged and follow-up with his PCP and had repeat blood work done.  Most of the abnormalities had resolved but he had new elevations of his LFTs and it was recommended that he come here for further evaluation.  He denies any abdominal pain.  No alcohol use no history of hepatitis or injection drugs.  No known liver disease.  The history is provided by the patient.  Abnormal Lab Time since result:  1 day Patient referred by:  PCP Resulting agency:  Internal Result type: LFT/amylase/lipase   LFT / amylase / lipase:    ALT (SGPT):  High   AST (SGOT):  High      Home Medications Prior to Admission medications   Medication Sig Start Date End Date Taking? Authorizing Provider  gabapentin (NEURONTIN) 300 MG capsule Take 300 mg by mouth 3 (three) times daily. 04/30/20   [provider]  HYDROcodone-acetaminophen (NORCO/VICODIN) 5-325 MG per tablet Take 1 tablet by mouth 3 (three) times daily as needed for moderate pain.     [provider]  naproxen (NAPROSYN) 500 MG tablet Take 1 tablet (500 mg total) by mouth 2 (two) times daily with a meal. 12/22/22   Daphine Deutscher Mary-Margaret, FNP      Allergies    Penicillins    Review of Systems   Review of Systems  Constitutional:  Negative for fever.  HENT:  Negative for sore throat.   Eyes:  Negative for visual disturbance.  Respiratory:  Negative for shortness of breath.   Cardiovascular:  Negative for chest pain.   Gastrointestinal:  Negative for abdominal pain.  Genitourinary:  Negative for dysuria.  Skin:  Positive for wound.    Physical Exam Updated Vital Signs BP 122/77 (BP Location: Right Arm)   Pulse (!) 102   Temp 99 F (37.2 C) (Oral)   Resp 20   Ht 5\' 8"  (1.727 m)   Wt 56.7 kg   SpO2 98%   BMI 19.01 kg/m  Physical Exam Vitals and nursing note reviewed.  Constitutional:      General: He is not in acute distress.    Appearance: Normal appearance. He is well-developed.  HENT:     Head: Normocephalic.     Comments: He has some abrasions over his forehead Eyes:     Conjunctiva/sclera: Conjunctivae normal.  Cardiovascular:     Rate and Rhythm: Regular rhythm. Tachycardia present.     Heart sounds: No murmur heard. Pulmonary:     Effort: Pulmonary effort is normal. No respiratory distress.     Breath sounds: Normal breath sounds.  Abdominal:     Palpations: Abdomen is soft.     Tenderness: There is no abdominal tenderness. There is no guarding or rebound.  Musculoskeletal:        General: No deformity. Normal range of motion.     Cervical back: Neck supple.  Skin:  General: Skin is warm and dry.     Capillary Refill: Capillary refill takes less than 2 seconds.  Neurological:     General: No focal deficit present.     Mental Status: He is alert.     ED Results / Procedures / Treatments   Labs (all labs ordered are listed, but only abnormal results are displayed) Labs Reviewed  COMPREHENSIVE METABOLIC PANEL - Abnormal; Notable for the following components:      Result Value   Glucose, Bld 109 (*)    Calcium 8.6 (*)    AST 673 (*)    ALT 243 (*)    All other components within normal limits  CBC  LIPASE, BLOOD  ACETAMINOPHEN LEVEL  PROTIME-INR  HEPATITIS PANEL, ACUTE    EKG None  Radiology US Abdomen Limited RUQ (LIVER/GB)  Result Date: 06/22/2023 CLINICAL DATA:  Elevated liver function tests EXAM: ULTRASOUND ABDOMEN LIMITED RIGHT UPPER QUADRANT  COMPARISON:  None Available. FINDINGS: Gallbladder: No gallstones or wall thickening visualized. No sonographic Murphy sign noted by sonographer. Common bile duct: Diameter: 4 mm Liver: No focal lesion identified. Within normal limits in parenchymal echogenicity. Portal vein is patent on color Doppler imaging with normal direction of blood flow towards the liver. Other: None. IMPRESSION: No gallstones or ductal dilatation. Electronically Signed   By: Karen Kays M.D.   On: 06/22/2023 14:05    Procedures Procedures    Medications Ordered in ED Medications - No data to display  ED Course/ Medical Decision Making/ A&P Clinical Course as of 06/22/23 1713  Fri Jun 22, 2023  1318 He does say he is taken 3 hydrocodone a day but not taking any Tylenol above that. [MB]  1508 I reviewed this case with Dr. Tasia Catchings from gastroenterology.  He felt if his INR was normal that he would not need admission to the hospital.  Will need repeat blood work with his PCP and can also follow-up with GI.  Patient comfortable plan for outpatient follow-up.  Return instructions discussed. [MB]    Clinical Course User Index [MB] Terrilee Files, MD                             Medical Decision Making Amount and/or Complexity of Data Reviewed Labs: ordered. Radiology: ordered.   This patient complains of abnormal labs; this involves an extensive number of treatment Options and is a complaint that carries with it a high risk of complications and morbidity. The differential includes lab error, hemolysis, liver failure, Tylenol poisoning, hepatitis, obstruction  I ordered, reviewed and interpreted labs, which included CBC normal chemistries normal other than mildly elevated glucose, liver enzymes with elevation of AST and ALT, Tylenol negative, lipase normal, INR normal I ordered imaging studies which included right upper quadrant ultrasound and I independently    visualized and interpreted imaging which showed no  acute findings Additional history obtained from patient's family member Previous records obtained and reviewed in epic including recent ED visit I consulted GI Dr. Tasia Catchings and discussed lab and imaging findings and discussed disposition.  Cardiac monitoring reviewed, sinus rhythm Social determinants considered, tobacco use financial insecurity Critical Interventions: None  After the interventions stated above, I reevaluated the patient and found patient to be asymptomatic  Admission and further testing considered, no indications for admission or further workup at this time.  Patient will follow-up with PCP to get further lab work done.  Return instructions discussed  Final Clinical Impression(s) / ED Diagnoses Final diagnoses:  Elevated LFTs    Rx / DC Orders ED Discharge Orders     None         Terrilee Files, MD 06/22/23 [phone]

## 2023-06-22 NOTE — Discharge Instructions (Addendum)
You were seen in the emergency department for elevated liver function tests.  You had an ultrasound of your gallbladder and liver along with other blood work that did not show any significant abnormalities other than the elevated liver tests.  You will need repeat lab work done next week along with following up with GI.  Please call your primary care doctor to help schedule this.  Return to the emergency department if any worsening or concerning symptoms

## 2023-06-22 NOTE — ED Provider Notes (Incomplete)
   EMERGENCY DEPARTMENT AT [facility] Provider Note   CSN: [id number] Arrival date & time: 06/22/23  1223     History {Add pertinent medical, surgical, social history, OB history to HPI:1} Chief Complaint  Patient presents with  . Abnormal Lab         Jason Lamb is a 62 y.o. male.  He has a history of GERD Graves' disease chronic back pain.  He was seen here 4 days ago after having fallen out of bed.  Had significant hip pain along with abrasions.  Imaging was negative, had some baseline labs drawn that showed a low potassium elevated white count.   HPI     Home Medications Prior to Admission medications   Medication Sig Start Date End Date Taking? Authorizing Provider  gabapentin (NEURONTIN) 300 MG capsule Take 300 mg by mouth 3 (three) times daily. 04/30/20   [provider]  HYDROcodone-acetaminophen (NORCO/VICODIN) 5-325 MG per tablet Take 1 tablet by mouth 3 (three) times daily as needed for moderate pain.     [provider]  naproxen (NAPROSYN) 500 MG tablet Take 1 tablet (500 mg total) by mouth 2 (two) times daily with a meal. 12/22/22   Bennie Pierini, FNP      Allergies    Penicillins    Review of Systems   Review of Systems  Physical Exam Updated Vital Signs BP 122/77 (BP Location: Right Arm)   Pulse (!) 102   Temp 99 F (37.2 C) (Oral)   Resp 20   Ht 5\' 8"  (1.727 m)   Wt 56.7 kg   SpO2 98%   BMI 19.01 kg/m  Physical Exam  ED Results / Procedures / Treatments   Labs (all labs ordered are listed, but only abnormal results are displayed) Labs Reviewed  CBC  COMPREHENSIVE METABOLIC PANEL    EKG None  Radiology No results found.  Procedures Procedures  {Document cardiac monitor, telemetry assessment procedure when appropriate:1}  Medications Ordered in ED Medications - No data to display  ED Course/ Medical Decision Making/ A&P   {   Click here for ABCD2, HEART and other calculatorsREFRESH  Note before signing :1}                          Medical Decision Making Amount and/or Complexity of Data Reviewed Labs: ordered. Radiology: ordered.   ***  {Document critical care time when appropriate:1} {Document review of labs and clinical decision tools ie heart score, Chads2Vasc2 etc:1}  {Document your independent review of radiology images, and any outside records:1} {Document your discussion with family members, caretakers, and with consultants:1} {Document social determinants of health affecting pt's care:1} {Document your decision making why or why not admission, treatments were needed:1} Final Clinical Impression(s) / ED Diagnoses Final diagnoses:  None    Rx / DC Orders ED Discharge Orders     None

## 2023-06-25 NOTE — Telephone Encounter (Signed)
See notes from previous message-do we need a referral

## 2023-06-25 NOTE — Telephone Encounter (Signed)
Jason Lamb, can you place a referral in epic to [place] for this patient so we can get him scheduled for a new patient apt. Thanks  Dx: elevated LFT's

## 2023-06-26 ENCOUNTER — Encounter: Payer: Self-pay | Admitting: Family

## 2023-06-26 ENCOUNTER — Ambulatory Visit (INDEPENDENT_AMBULATORY_CARE_PROVIDER_SITE_OTHER): Payer: Self-pay | Admitting: Family

## 2023-06-26 VITALS — BP 117/73 | HR 98 | Temp 97.8°F | Ht 68.0 in | Wt 120.4 lb

## 2023-06-26 DIAGNOSIS — M25552 Pain in left hip: Secondary | ICD-10-CM

## 2023-06-26 DIAGNOSIS — R748 Abnormal levels of other serum enzymes: Secondary | ICD-10-CM

## 2023-06-26 DIAGNOSIS — Z09 Encounter for follow-up examination after completed treatment for conditions other than malignant neoplasm: Secondary | ICD-10-CM

## 2023-06-26 NOTE — Patient Instructions (Signed)
Elevated Liver Enzymes  When the liver gets hurt, it lets out substances called enzymes into the blood. This makes the enzyme levels in your blood go up. These are elevated liver enzymes. A blood test can detect this. The most common enzymes released are alanine transaminase (ALT) and aspartate transaminase (AST). These two enzymes are also called transaminases. What causes elevated liver enzymes? Elevated liver enzymes can be caused by: Liver problems, like hepatitis B or C. Major muscle injury. Regular or heavy alcohol use. Certain medicines and supplements. If liver enzymes are a little higher than normal people often do not have symptoms. Your health care provider may do more blood tests or imaging tests to help find the cause of your elevated liver enzymes. Follow these instructions at home: Medicines Ask about changing or stopping: Any medicines you take. Any vitamins, herbs, or supplements you take. Do not take aspirin or ibuprofen unless you're told to. Alcohol use Do not drink alcohol if: Your provider tells you not to drink. You're pregnant, may be pregnant, or plan to become pregnant. If you drink alcohol: Limit how much you have to: 0-1 drink a day if you're male. 0-2 drinks a day if you're male. Know how much alcohol is in your drink. In the [place] one drink is one 12 oz bottle of beer (355 mL), one 5 oz glass of wine (148 mL), or one 1 oz glass of hard liquor (44 mL). Lifestyle  To help your liver heal, your provider may tell you to: Eat a healthy diet. Exercise. Lose weight. Limit foods and drinks that have a lot of sugar. General instructions Drink more fluids as told. Ask what things are safe for you to do at home. Ask when you can go back to work or school. Keep all follow-up visits. The provider will check if your liver enzymes are going down. Contact a health care provider if: You have pain or swelling in your belly. Your skin or eyes turn yellow. You  lose weight without trying. You throw up or feel like you may throw up. This information is not intended to replace advice given to you by your health care provider. Make sure you discuss any questions you have with your health care provider. Document Revised: 03/19/2023 Document Reviewed: 03/19/2023 Elsevier Patient Education  2024 [employer].

## 2023-06-26 NOTE — Progress Notes (Signed)
Subjective:    Patient ID: Jason Lamb, male    DOB: [birth year], 62 y.o.   MRN: [id number]  Chief Complaint  Patient presents with   Hospitalization Follow-up   He went to the ED on 06/18/23 after falling out of his bed and hurting his left hip. Had negative x-rays. He then did a follow up at our office and found to have found to have an elevated AST 846 and ALT 265. He was told to go to ED.   There his AST decreased to 673 and ALT to 243. Had a negative hepatitis panel, lipase. And normal INR, PT. He had a normal [place] of abdomen.  Reports he is feeling better, but does have some soreness in left hip.   He is followed by Pain Clinic every 3 months for chronic back pain. He is taking Norco every 8 hours.  Hip Pain  The incident occurred more than 1 week ago. The injury mechanism was a fall. The pain is present in the left hip. The pain is at a severity of 6/10. Pertinent negatives include no muscle weakness or tingling. He reports no foreign bodies present. He has tried rest for the symptoms. The treatment provided mild relief.      Review of Systems  Neurological:  Negative for tingling.  All other systems reviewed and are negative.      Objective:   Physical Exam Vitals reviewed.  Constitutional:      General: He is not in acute distress.    Appearance: He is well-developed.  HENT:     Head: Normocephalic.  Eyes:     General:        Right eye: No discharge.        Left eye: No discharge.     Pupils: Pupils are equal, round, and reactive to light.  Neck:     Thyroid: No thyromegaly.  Cardiovascular:     Rate and Rhythm: Normal rate and regular rhythm.     Heart sounds: Normal heart sounds. No murmur heard. Pulmonary:     Effort: Pulmonary effort is normal. No respiratory distress.     Breath sounds: Normal breath sounds. No wheezing.  Abdominal:     General: Bowel sounds are normal. There is no distension.     Palpations: Abdomen is soft.     Tenderness: There is no  abdominal tenderness.  Musculoskeletal:        General: No tenderness. Normal range of motion.     Cervical back: Normal range of motion and neck supple.  Skin:    General: Skin is warm and dry.     Findings: No erythema or rash.  Neurological:     Mental Status: He is alert and oriented to person, place, and time.     Cranial Nerves: No cranial nerve deficit.     Deep Tendon Reflexes: Reflexes are normal and symmetric.  Psychiatric:        Behavior: Behavior normal.        Thought Content: Thought content normal.        Judgment: Judgment normal.     BP 117/73   Pulse 98   Temp 97.8 F (36.6 C)   Ht 5\' 8"  (1.727 m)   Wt 120 lb 6.4 oz (54.6 kg)   SpO2 95%   BMI 18.31 kg/m        Assessment & Plan:   Jason Lamb comes in today with chief complaint of Hospitalization Follow-up  Diagnosis and orders addressed:  1. Pain of left hip - BMP8+EGFR - Hepatic function panel  2. Hospital discharge follow-up - BMP8+EGFR - Hepatic function panel  3. Elevated liver enzymes - BMP8+EGFR - Hepatic function panel   Labs pending If liver enzymes still elevated, will need to call Pain Clinic and change medication to something without tylenol. If still elevated, will do referral to GI Hospital notes reviewed  Avoid alcohol Low fat diet and exercise   Follow up plan: With PCP in next 2-3 months   Jannifer Rodney, FNP

## 2023-06-26 NOTE — Addendum Note (Signed)
Addended by: Lorelee Cover C on: 06/26/2023 10:36 AM   Modules accepted: Orders

## 2023-06-26 NOTE — Telephone Encounter (Signed)
Please review

## 2023-06-26 NOTE — Telephone Encounter (Signed)
Referral placed.

## 2023-06-26 NOTE — Telephone Encounter (Signed)
Please do referral to dr. Tasia Catchings for elevated LFT

## 2023-06-27 ENCOUNTER — Other Ambulatory Visit: Payer: Self-pay

## 2023-06-27 DIAGNOSIS — R748 Abnormal levels of other serum enzymes: Secondary | ICD-10-CM

## 2023-06-27 NOTE — Telephone Encounter (Signed)
Sent message to Shanda Bumps as referral was done under from [facility] telephone and placed it in our outgoing workque as if we were doing referral; Advised closing referral and new referral would need to be done.

## 2023-07-04 ENCOUNTER — Ambulatory Visit (INDEPENDENT_AMBULATORY_CARE_PROVIDER_SITE_OTHER): Payer: Self-pay | Admitting: Gastroenterology

## 2023-07-04 ENCOUNTER — Encounter (INDEPENDENT_AMBULATORY_CARE_PROVIDER_SITE_OTHER): Payer: Self-pay | Admitting: Gastroenterology

## 2023-07-11 ENCOUNTER — Other Ambulatory Visit: Payer: Self-pay | Admitting: Family Medicine

## 2023-07-11 DIAGNOSIS — R748 Abnormal levels of other serum enzymes: Secondary | ICD-10-CM

## 2023-07-18 ENCOUNTER — Other Ambulatory Visit: Payer: Self-pay | Admitting: Family Medicine

## 2023-07-20 ENCOUNTER — Other Ambulatory Visit: Payer: Self-pay

## 2023-07-20 ENCOUNTER — Encounter: Payer: Self-pay | Admitting: Nurse Practitioner

## 2023-09-27 ENCOUNTER — Ambulatory Visit: Payer: Self-pay | Admitting: Nurse Practitioner

## 2023-09-28 ENCOUNTER — Encounter: Payer: Self-pay | Admitting: Nurse Practitioner

## 2023-09-28 ENCOUNTER — Ambulatory Visit (INDEPENDENT_AMBULATORY_CARE_PROVIDER_SITE_OTHER): Payer: Self-pay | Admitting: Nurse Practitioner

## 2023-09-28 VITALS — BP 92/60 | HR 89 | Temp 98.0°F | Resp 20 | Ht 68.0 in | Wt 117.0 lb

## 2023-09-28 DIAGNOSIS — Z720 Tobacco use: Secondary | ICD-10-CM

## 2023-09-28 DIAGNOSIS — R7989 Other specified abnormal findings of blood chemistry: Secondary | ICD-10-CM

## 2023-09-28 DIAGNOSIS — K219 Gastro-esophageal reflux disease without esophagitis: Secondary | ICD-10-CM

## 2023-09-28 DIAGNOSIS — F419 Anxiety disorder, unspecified: Secondary | ICD-10-CM

## 2023-09-28 DIAGNOSIS — E0500 Thyrotoxicosis with diffuse goiter without thyrotoxic crisis or storm: Secondary | ICD-10-CM

## 2023-09-28 DIAGNOSIS — F339 Major depressive disorder, recurrent, unspecified: Secondary | ICD-10-CM

## 2023-09-28 MED ORDER — ESCITALOPRAM OXALATE 10 MG PO TABS
10.0000 mg | ORAL_TABLET | Freq: Every day | ORAL | 5 refills | Status: AC
Start: 2023-09-28 — End: ?

## 2023-09-28 NOTE — Progress Notes (Signed)
Subjective:    Patient ID: Jason Lamb, male    DOB: July 14, 1961, 62 y.o.   MRN: [id number]   Chief Complaint: Medical Management of Chronic Issues    HPI:  Jason Lamb is a 62 y.o. who identifies as a male who was assigned male at birth.   Social history: Lives with: by hisself- family checks on him frequently Work history: [employer] in today for follow up of the following chronic medical issues:  1. Gastroesophageal reflux disease without esophagitis No recent symptoms  2. Graves disease No issues that aware of. No labs results on file  3. Anxiety 4. Depression, recurrent (HCC) On no meds. Says he is doing ok. Wants xanax presription    09/28/2023   12:49 PM 06/26/2023    2:23 PM 06/21/2023    2:16 PM 12/22/2022    8:57 AM  GAD 7 : Generalized Anxiety Score  Nervous, Anxious, on Edge 3 3 3 2   Control/stop worrying 3 3 3 2   Worry too much - different things 3 3 3 3   Trouble relaxing 3 3 3 3   Restless 3 3 3 3   Easily annoyed or irritable 3 3 2 1   Afraid - awful might happen 3 3 2 3   Total GAD 7 Score 21 21 19 17   Anxiety Difficulty Very difficult Extremely difficult Extremely difficult Somewhat difficult       09/28/2023   12:48 PM 06/26/2023    2:22 PM 06/21/2023    2:15 PM  Depression screen PHQ 2/9  Decreased Interest 1 1 2   Down, Depressed, Hopeless 2 3 3   PHQ - 2 Score 3 4 5   Altered sleeping 3 3 3   Tired, decreased energy 3 2 1   Change in appetite 3 2 1   Feeling bad or failure about yourself  2 1 1   Trouble concentrating 3 3 3   Moving slowly or fidgety/restless 3 1 2   Suicidal thoughts 0 0 0  PHQ-9 Score 20 16 16   Difficult doing work/chores  Very difficult Very difficult     5. Tobacco abuse Smokes over a pack a day- refuses low dose CT scan.   New complaints: LFT elevated at last visit - needs repeated  Allergies  Allergen Reactions   Penicillins Other (See Comments)    Childhood allergy Did it involve swelling of the  face/tongue/throat, SOB, or low BP? Unknown Did it involve sudden or severe rash/hives, skin peeling, or any reaction on the inside of your mouth or nose? Unknown Did you need to seek medical attention at a hospital or doctor's office? Unknown When did it last happen?       If all above answers are "NO", may proceed with cephalosporin use.    Outpatient Encounter Medications as of 09/28/2023  Medication Sig   gabapentin (NEURONTIN) 300 MG capsule Take 300 mg by mouth 3 (three) times daily.   HYDROcodone-acetaminophen (NORCO/VICODIN) 5-325 MG per tablet Take 1 tablet by mouth 3 (three) times daily as needed for moderate pain.    No facility-administered encounter medications on file as of 09/28/2023.    Past Surgical History:  Procedure Laterality Date   APPENDECTOMY     COLONOSCOPY N/A 04/08/2014   Procedure: COLONOSCOPY;  Surgeon: Malissa Hippo, MD;  Location: [facility];  Service: Endoscopy;  Laterality: N/A;  135   ESOPHAGOGASTRODUODENOSCOPY N/A 04/08/2014   Procedure: ESOPHAGOGASTRODUODENOSCOPY (EGD);  Surgeon: Malissa Hippo, MD;  Location: [facility];  Service: Endoscopy;  Laterality: N/A;    History reviewed. No pertinent family history.    Controlled substance contract: n/a     Review of Systems  Constitutional:  Negative for diaphoresis.  Eyes:  Negative for pain.  Respiratory:  Negative for shortness of breath.   Cardiovascular:  Negative for chest pain, palpitations and leg swelling.  Gastrointestinal:  Negative for abdominal pain.  Endocrine: Negative for polydipsia.  Skin:  Negative for rash.  Neurological:  Negative for dizziness, weakness and headaches.  Hematological:  Does not bruise/bleed easily.  All other systems reviewed and are negative.      Objective:   Physical Exam Vitals and nursing note reviewed.  Constitutional:      Appearance: Normal appearance. He is well-developed.  HENT:     Head: Normocephalic.     Nose: Nose normal.      Mouth/Throat:     Mouth: Mucous membranes are moist.     Pharynx: Oropharynx is clear.  Eyes:     Pupils: Pupils are equal, round, and reactive to light.  Neck:     Thyroid: No thyroid mass or thyromegaly.     Vascular: No carotid bruit or JVD.     Trachea: Phonation normal.  Cardiovascular:     Rate and Rhythm: Normal rate and regular rhythm.  Pulmonary:     Effort: Pulmonary effort is normal. No respiratory distress.     Breath sounds: Normal breath sounds.  Abdominal:     General: Bowel sounds are normal.     Palpations: Abdomen is soft.     Tenderness: There is no abdominal tenderness.  Musculoskeletal:        General: Normal range of motion.     Cervical back: Normal range of motion and neck supple.  Lymphadenopathy:     Cervical: No cervical adenopathy.  Skin:    General: Skin is warm and dry.  Neurological:     Mental Status: He is alert and oriented to person, place, and time.  Psychiatric:        Behavior: Behavior normal.        Thought Content: Thought content normal.        Judgment: Judgment normal.     BP 92/60   Pulse 89   Temp 98 F (36.7 C) (Temporal)   Resp 20   Ht 5\' 8"  (1.727 m)   Wt 117 lb (53.1 kg)   SpO2 96%   BMI 17.79 kg/m        Assessment & Plan:   Jason Lamb comes in today with chief complaint of Medical Management of Chronic Issues   Diagnosis and orders addressed:  1. Gastroesophageal reflux disease without esophagitis Avoid spicy foods Do not eat 2 hours prior to bedtime   2. Graves disease Labs pending - Thyroid Panel With TSH  3. Anxiety Stress management Denied him xanax - escitalopram (LEXAPRO) 10 MG tablet; Take 1 tablet (10 mg total) by mouth daily.  Dispense: 30 tablet; Refill: 5  4. Depression, recurrent (HCC) - escitalopram (LEXAPRO) 10 MG tablet; Take 1 tablet (10 mg total) by mouth daily.  Dispense: 30 tablet; Refill: 5  5. Tobacco abuse Smoking cessation encouraged  6. Elevated LFTs Labs  pending Avoid alcohol and tylenol - CMP14+EGFR   Labs pending Health Maintenance reviewed Diet and exercise encouraged  Follow up plan: Based on lab results   Mary-Margaret Daphine Deutscher, FNP

## 2023-09-29 LAB — THYROID PANEL WITH TSH
Free Thyroxine Index: 2 (ref 1.2–4.9)
T3 Uptake Ratio: 26 % (ref 24–39)
T4, Total: 7.7 ug/dL (ref 4.5–12.0)
TSH: 0.653 u[IU]/mL (ref 0.450–4.500)

## 2023-09-29 LAB — CMP14+EGFR
ALT: 6 [IU]/L (ref 0–44)
AST: 10 [IU]/L (ref 0–40)
Albumin: 4.3 g/dL (ref 3.8–4.9)
Alkaline Phosphatase: 70 [IU]/L (ref 44–121)
BUN/Creatinine Ratio: 16 (ref 10–24)
BUN: 13 mg/dL (ref 8–27)
Bilirubin Total: <0.2 mg/dL (ref 0.0–1.2)
CO2: 24 mmol/L (ref 20–29)
Calcium: 8.9 mg/dL (ref 8.6–10.2)
Chloride: 110 mmol/L — ABNORMAL HIGH (ref 96–106)
Creatinine, Ser: 0.81 mg/dL (ref 0.76–1.27)
Globulin, Total: 1.9 g/dL (ref 1.5–4.5)
Glucose: 82 mg/dL (ref 70–99)
Potassium: 4.2 mmol/L (ref 3.5–5.2)
Sodium: 149 mmol/L — ABNORMAL HIGH (ref 134–144)
Total Protein: 6.2 g/dL (ref 6.0–8.5)
eGFR: 101 mL/min/{1.73_m2} (ref >59)

## 2023-10-11 ENCOUNTER — Ambulatory Visit (HOSPITAL_COMMUNITY): Payer: Self-pay | Admitting: Clinical

## 2023-10-12 ENCOUNTER — Other Ambulatory Visit: Payer: Self-pay | Admitting: Nurse Practitioner

## 2023-10-12 NOTE — Telephone Encounter (Signed)
Last office visit 09/28/23 On med list as historical, requires approval by  PCP

## 2023-11-13 ENCOUNTER — Telehealth (HOSPITAL_COMMUNITY): Payer: Self-pay

## 2023-11-13 NOTE — Telephone Encounter (Signed)
Lvm to confirm 11/15/23 appt by 12:00 11/14/23

## 2023-11-14 ENCOUNTER — Encounter (HOSPITAL_COMMUNITY): Payer: Self-pay

## 2023-11-14 NOTE — Telephone Encounter (Signed)
Called pt no answer left vm sent mychart message cancelling appointment as it has not been confirmed

## 2023-11-15 ENCOUNTER — Ambulatory Visit (HOSPITAL_COMMUNITY): Payer: Self-pay | Admitting: Clinical

## 2024-09-10 ENCOUNTER — Encounter (INDEPENDENT_AMBULATORY_CARE_PROVIDER_SITE_OTHER): Payer: Self-pay | Admitting: Gastroenterology
